# Patient Record
Sex: Female | Born: 1985 | Race: White | Hispanic: No | Marital: Single | Smoking: Current every day smoker
Health system: Southern US, Community
[De-identification: ages and names within clinical notes are randomized; demographics above are authoritative.]

---
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
## 2010-02-26 ENCOUNTER — Encounter: Payer: Self-pay | Admitting: *Deleted

## 2013-03-01 ENCOUNTER — Encounter (HOSPITAL_COMMUNITY): Payer: Self-pay | Admitting: Emergency Medicine

## 2013-03-01 ENCOUNTER — Emergency Department (HOSPITAL_COMMUNITY)
Admission: EM | Admit: 2013-03-01 | Discharge: 2013-03-01 | Disposition: A | Discharge status: Home / Self Care | Payer: Self-pay | Attending: Emergency Medicine | Admitting: Emergency Medicine

## 2013-03-01 DIAGNOSIS — B029 Zoster without complications: Secondary | ICD-10-CM | POA: Insufficient documentation

## 2013-03-01 DIAGNOSIS — F17200 Nicotine dependence, unspecified, uncomplicated: Secondary | ICD-10-CM | POA: Insufficient documentation

## 2013-03-01 MED ORDER — HYDROCODONE-ACETAMINOPHEN 5-325 MG PO TABS: 1.0000 | ORAL_TABLET | ORAL | Status: DC | PRN

## 2013-03-01 MED ORDER — ACYCLOVIR 400 MG PO TABS: 800.0000 mg | ORAL_TABLET | Freq: Every day | ORAL | Status: DC

## 2013-03-01 NOTE — Discharge Instructions (Signed)
Take Acyclovir as directed until gone. Take vicodin as needed for pain. Refer to attached documents for more information. Keep the rash covered.

## 2013-03-01 NOTE — ED Provider Notes (Signed)
CSN: [id number]     Arrival date & time 03/01/13  1104 History   This chart was scribed for non-physician practitioner Alexandra BeckKaitlyn Rivaan Kendall, PA-C, working with Alexandra HornJohn M Bednar, MD, by Alexandra Bruce, ED Scribe. This patient was seen in room TR08C/TR08C and the patient's care was started at 11:24 AM. First MD Initiated Contact with Patient 03/01/13 1114     Chief Complaint  Patient presents with  . Rash   The history is provided by the patient. No language interpreter was used.   HPI Comments: Alexandra Bruce is a 28 y.o. female who presents to the Emergency Department complaining of a rash to her right axilla and breast which began yesterday and has been associated with acute pain. The pt describes the rash as red with small, closed blisters. The pt rates the pain as 8/10, and she characterizes the pain as "throbbing, aching, and burning." She had chicken pox as a child. Alexandra Bruce is a current smoker.    History reviewed. No pertinent past medical history. History reviewed. No pertinent past surgical history. History reviewed. No pertinent family history. History  Substance Use Topics  . Smoking status: Current Every Day Smoker  . Smokeless tobacco: Not on file  . Alcohol Use: Yes   No OB history provided.  Review of Systems  Constitutional: Negative for fever.  Skin: Positive for color change and rash.  All other systems reviewed and are negative.    Allergies  Review of patient's allergies indicates no known allergies.  Home Medications  No current outpatient prescriptions on file.  Triage Vitals: BP 132/56  Pulse 117  Temp(Src) 98 F (36.7 C) (Oral)  Resp 20  Ht 5' (1.524 m)  Wt 140 lb (63.504 kg)  BMI 27.34 kg/m2  SpO2 98%  Physical Exam  Nursing note and vitals reviewed. Constitutional: She is oriented to person, place, and time. She appears well-developed and well-nourished. No distress.  HENT:  Head: Normocephalic and atraumatic.  Eyes: EOM are normal.   Neck: Neck supple. No tracheal deviation present.  Cardiovascular: Normal rate.   Pulmonary/Chest: Effort normal. No respiratory distress.  Musculoskeletal: Normal range of motion.  Neurological: She is alert and oriented to person, place, and time.  Skin: Skin is warm and dry.  Scattered fluid-filled vesicles on erythematous base located above right breast.   Psychiatric: She has a normal mood and affect. Her behavior is normal.    ED Course  Procedures (including critical care time)  DIAGNOSTIC STUDIES: Oxygen Saturation is 98% on room air, normal by my interpretation.    COORDINATION OF CARE:  11:25 AM- Discussed treatment plan with patient, which includes a prescription for an anti-viral medication and pain medication, and the patient agreed to the plan. Reminded pt to keep the rash covered with gauze or clothing because the rash is contagious to others.   Labs Review Labs Reviewed - No data to display Imaging Review No results found.  EKG Interpretation   None       MDM   1. Shingles    11:31 AM Patient has shingles. Patient will have acyclovir and vicodin for pain. Patient advised to keep rash covered.   I personally performed the services described in this documentation, which was scribed in my presence. The recorded information has been reviewed and is accurate.    Alexandra BeckKaitlyn Tacori Kvamme, PA-C 03/01/13 1133

## 2013-03-01 NOTE — ED Notes (Signed)
Pt here with a rash to the right breast and armpit area, rash and red and has small closed blisters

## 2013-03-01 NOTE — ED Provider Notes (Signed)
Medical screening examination/treatment/procedure(s) were performed by non-physician practitioner and as supervising physician I was immediately available for consultation/collaboration.  EKG Interpretation   None        Hurman HornJohn M Karion Cudd, MD 03/01/13 2129

## 2013-03-06 ENCOUNTER — Encounter: Payer: Self-pay | Admitting: Family Medicine

## 2013-03-06 ENCOUNTER — Ambulatory Visit (INDEPENDENT_AMBULATORY_CARE_PROVIDER_SITE_OTHER): Payer: Self-pay | Admitting: Family Medicine

## 2013-03-06 VITALS — BP 122/70 | Ht 60.0 in | Wt 151.0 lb

## 2013-03-06 DIAGNOSIS — B029 Zoster without complications: Secondary | ICD-10-CM

## 2013-03-06 MED ORDER — HYDROCODONE-ACETAMINOPHEN 5-325 MG PO TABS
ORAL_TABLET | ORAL | Status: DC
Start: 2013-03-06 — End: 2014-12-12

## 2013-03-06 NOTE — Patient Instructions (Signed)
Also take two aleave twice per day  Shingles Shingles (herpes zoster) is an infection that is caused by the same virus that causes chickenpox (varicella). The infection causes a painful skin rash and fluid-filled blisters, which eventually break open, crust over, and heal. It may occur in any area of the body, but it usually affects only one side of the body or face. The pain of shingles usually lasts about 1 month. However, some people with shingles may develop long-term (chronic) pain in the affected area of the body. Shingles often occurs many years after the person had chickenpox. It is more common:  In people older than 50 years.  In people with weakened immune systems, such as those with HIV, AIDS, or cancer.  In people taking medicines that weaken the immune system, such as transplant medicines.  In people under great stress. CAUSES  Shingles is caused by the varicella zoster virus (VZV), which also causes chickenpox. After a person is infected with the virus, it can remain in the person's body for years in an inactive state (dormant). To cause shingles, the virus reactivates and breaks out as an infection in a nerve root. The virus can be spread from person to person (contagious) through contact with open blisters of the shingles rash. It will only spread to people who have not had chickenpox. When these people are exposed to the virus, they may develop chickenpox. They will not develop shingles. Once the blisters scab over, the person is no longer contagious and cannot spread the virus to others. SYMPTOMS  Shingles shows up in stages. The initial symptoms may be pain, itching, and tingling in an area of the skin. This pain is usually described as burning, stabbing, or throbbing.In a few days or weeks, a painful red rash will appear in the area where the pain, itching, and tingling were felt. The rash is usually on one side of the body in a band or belt-like pattern. Then, the rash usually  turns into fluid-filled blisters. They will scab over and dry up in approximately 2 3 weeks. Flu-like symptoms may also occur with the initial symptoms, the rash, or the blisters. These may include:  Fever.  Chills.  Headache.  Upset stomach. DIAGNOSIS  Your caregiver will perform a skin exam to diagnose shingles. Skin scrapings or fluid samples may also be taken from the blisters. This sample will be examined under a microscope or sent to a lab for further testing. TREATMENT  There is no specific cure for shingles. Your caregiver will likely prescribe medicines to help you manage the pain, recover faster, and avoid long-term problems. This may include antiviral drugs, anti-inflammatory drugs, and pain medicines. HOME CARE INSTRUCTIONS   Take a cool bath or apply cool compresses to the area of the rash or blisters as directed. This may help with the pain and itching.   Only take over-the-counter or prescription medicines as directed by your caregiver.   Rest as directed by your caregiver.  Keep your rash and blisters clean with mild soap and cool water or as directed by your caregiver.  Do not pick your blisters or scratch your rash. Apply an anti-itch cream or numbing creams to the affected area as directed by your caregiver.  Keep your shingles rash covered with a loose bandage (dressing).  Avoid skin contact with:  Babies.   Pregnant women.   Children with eczema.   Elderly people with transplants.   People with chronic illnesses, such as leukemia or AIDS.  Wear loose-fitting clothing to help ease the pain of material rubbing against the rash.  Keep all follow-up appointments with your caregiver.If the area involved is on your face, you may receive a referral for follow-up to a specialist, such as an eye doctor (ophthalmologist) or an ear, nose, and throat (ENT) doctor. Keeping all follow-up appointments will help you avoid eye complications, chronic pain, or  disability.  SEEK IMMEDIATE MEDICAL CARE IF:   You have facial pain, pain around the eye area, or loss of feeling on one side of your face.  You have ear pain or ringing in your ear.  You have loss of taste.  Your pain is not relieved with prescribed medicines.   Your redness or swelling spreads.   You have more pain and swelling.  Your condition is worsening or has changed.   You have a feveror persistent symptoms for more than 2 3 days.  You have a fever and your symptoms suddenly get worse. MAKE SURE YOU:  Understand these instructions.  Will watch your condition.  Will get help right away if you are not doing well or get worse. Document Released: 01/22/2005 Document Revised: 10/17/2011 Document Reviewed: 09/06/2011 [facility] Patient Information 2014 [place].

## 2013-03-06 NOTE — Progress Notes (Signed)
   Subjective:    Patient ID: Alexandra Bruce, female    DOB: January 05, 1986, 28 y.o.   MRN: [id number]  HPI Patient is here today b/c she went to the ER on Sunday, and they diagnosed her with shingles.  They prescribed her acyclovir and hydrocodone.  It has spread even more to her back.  She is here to follow up.   First noticed a few bumps ehn showering  statted to get painful  Very painful   No fever or chills    Review of Systems No fever no chills no vomiting some back pain no cough ROS otherwise negative    Objective:   Physical Exam  Alert no significant distress. Lungs clear. Heart rare rhythm. Right superior chest and superior back multiple coalescing blisters and pustules      Assessment & Plan:  Impression shingles discussed at length plan maintain Zovirax. Further hydrocodone prescribed. Local measures discussed. Nature of disease discussed. WSL

## 2014-12-12 ENCOUNTER — Emergency Department (HOSPITAL_COMMUNITY): Payer: Self-pay

## 2014-12-12 ENCOUNTER — Emergency Department (HOSPITAL_COMMUNITY)
Admission: EM | Admit: 2014-12-12 | Discharge: 2014-12-12 | Disposition: A | Discharge status: Home / Self Care | Payer: Self-pay | Attending: Emergency Medicine | Admitting: Emergency Medicine

## 2014-12-12 ENCOUNTER — Encounter (HOSPITAL_COMMUNITY): Payer: Self-pay | Admitting: *Deleted

## 2014-12-12 DIAGNOSIS — W1839XA Other fall on same level, initial encounter: Secondary | ICD-10-CM | POA: Insufficient documentation

## 2014-12-12 DIAGNOSIS — Z720 Tobacco use: Secondary | ICD-10-CM | POA: Insufficient documentation

## 2014-12-12 DIAGNOSIS — Z79899 Other long term (current) drug therapy: Secondary | ICD-10-CM | POA: Insufficient documentation

## 2014-12-12 DIAGNOSIS — S62625A Displaced fracture of medial phalanx of left ring finger, initial encounter for closed fracture: Secondary | ICD-10-CM | POA: Insufficient documentation

## 2014-12-12 DIAGNOSIS — S62609A Fracture of unspecified phalanx of unspecified finger, initial encounter for closed fracture: Secondary | ICD-10-CM

## 2014-12-12 DIAGNOSIS — Y9289 Other specified places as the place of occurrence of the external cause: Secondary | ICD-10-CM | POA: Insufficient documentation

## 2014-12-12 DIAGNOSIS — Y998 Other external cause status: Secondary | ICD-10-CM | POA: Insufficient documentation

## 2014-12-12 DIAGNOSIS — Y9389 Activity, other specified: Secondary | ICD-10-CM | POA: Insufficient documentation

## 2014-12-12 MED ORDER — HYDROCODONE-ACETAMINOPHEN 5-325 MG PO TABS: 2.0000 | ORAL_TABLET | ORAL | Status: DC | PRN

## 2014-12-12 MED ORDER — IBUPROFEN 800 MG PO TABS: 800.0000 mg | ORAL_TABLET | Freq: Three times a day (TID) | ORAL | Status: DC

## 2014-12-12 MED ORDER — HYDROCODONE-ACETAMINOPHEN 5-325 MG PO TABS
2.0000 | ORAL_TABLET | Freq: Once | ORAL | Status: AC
  Administered 2014-12-12: 2 via ORAL
  Filled 2014-12-12: qty 2

## 2014-12-12 NOTE — Discharge Instructions (Signed)
Finger Fracture  Fractures of fingers are breaks in the bones of the fingers. There are many types of fractures. There are different ways of treating these fractures. Your health care provider will discuss the best way to treat your fracture.  CAUSES  Traumatic injury is the main cause of broken fingers. These include:  · Injuries while playing sports.  · Workplace injuries.  · Falls.  RISK FACTORS  Activities that can increase your risk of finger fractures include:  · Sports.  · Workplace activities that involve machinery.  · A condition called osteoporosis, which can make your bones less dense and cause them to fracture more easily.  SIGNS AND SYMPTOMS  The main symptoms of a broken finger are pain and swelling within 15 minutes after the injury. Other symptoms include:  · Bruising of your finger.  · Stiffness of your finger.  · Numbness of your finger.  · Exposed bones (compound fracture) if the fracture is severe.  DIAGNOSIS   The best way to diagnose a broken bone is with X-ray imaging. Additionally, your health care provider will use this X-ray image to evaluate the position of the broken finger bones.   TREATMENT   Finger fractures can be treated with:   · Nonreduction--This means the bones are in place. The finger is splinted without changing the positions of the bone pieces. The splint is usually left on for about a week to 10 days. This will depend on your fracture and what your health care provider thinks.  · Closed reduction--The bones are put back into position without using surgery. The finger is then splinted.  · Open reduction and internal fixation--The fracture site is opened. Then the bone pieces are fixed into place with pins or some type of hardware. This is seldom required. It depends on the severity of the fracture.  HOME CARE INSTRUCTIONS   · Follow your health care provider's instructions regarding activities, exercises, and physical therapy.  · Only take over-the-counter or prescription  medicines for pain, discomfort, or fever as directed by your health care provider.  SEEK MEDICAL CARE IF:  You have pain or swelling that limits the motion or use of your fingers.  SEEK IMMEDIATE MEDICAL CARE IF:   Your finger becomes numb.  MAKE SURE YOU:   · Understand these instructions.  · Will watch your condition.  · Will get help right away if you are not doing well or get worse.     This information is not intended to replace advice given to you by your health care provider. Make sure you discuss any questions you have with your health care provider.     Document Released: 05/06/2000 Document Revised: 11/12/2012 Document Reviewed: 09/03/2012  Elsevier Interactive Patient Education ©2016 [employer]

## 2014-12-12 NOTE — ED Provider Notes (Signed)
History  By signing my name below, I, [name], attest that this documentation has been prepared under the direction and in the presence of Langston MaskerKaren Lynasia Meloche, [place]PA-C. Electronically Signed: [name], ED Scribe. 12/12/2014. 7:32 PM.  Chief Complaint  Patient presents with  . Finger Injury   The history is provided by the patient and medical records. No language interpreter was used.    HPI Comments:  Alexandra Bruce is a 29 y.o. female who presents to the Emergency Department complaining of left fourth finger throbbing pain secondary to falling on it approximately two weeks ago. She reports associated bruising and swelling. Pt states this is the first evaluation she has had for this injury since it happened. She has not taken anything for pain. Moving or touching the area makes the pain worse. She denies alleviating factors. She denies numbness, tingling or weakness of the right fourth finger or right hand.   History reviewed. No pertinent past medical history. History reviewed. No pertinent past surgical history. History reviewed. No pertinent family history. Social History  Substance Use Topics  . Smoking status: Current Every Day Smoker  . Smokeless tobacco: None  . Alcohol Use: Yes   OB History    No data available     Review of Systems  Musculoskeletal: Positive for arthralgias.  All other systems reviewed and are negative.   Allergies  Review of patient's allergies indicates no known allergies.  Home Medications   Prior to Admission medications   Medication Sig Start Date End Date Taking? Authorizing Provider  acyclovir (ZOVIRAX) 400 MG tablet Take 2 tablets (800 mg total) by mouth 5 (five) times daily. 03/01/13   Emilia BeckKaitlyn Szekalski, PA-C  HYDROcodone-acetaminophen (NORCO/VICODIN) 5-325 MG per tablet TAKE ONE TABLET EVERY 4-6 HOURS PRN PAIN 03/06/13   Merlyn AlbertWilliam S Luking, MD   Triage Vitals: BP 112/78 mmHg  Pulse 89  Temp(Src) 98.1 F (36.7 C) (Oral)  Resp 18   Ht 5' (1.524 m)  Wt 135 lb (61.236 kg)  BMI 26.37 kg/m2  SpO2 100%  LMP 12/07/2014 Physical Exam  Constitutional: She is oriented to person, place, and time. She appears well-developed and well-nourished.  HENT:  Head: Normocephalic and atraumatic.  Eyes: EOM are normal.  Neck: Normal range of motion.  Cardiovascular: Normal rate.   Pulmonary/Chest: Effort normal.  Musculoskeletal: Normal range of motion.  Neurological: She is alert and oriented to person, place, and time.  Skin: Skin is warm and dry.  Psychiatric: She has a normal mood and affect. Her behavior is normal.  Nursing note and vitals reviewed.   ED Course  Procedures (including critical care time) DIAGNOSTIC STUDIES: Oxygen Saturation is 100% on RA, normal by my interpretation.   COORDINATION OF CARE: 7:31 PM- Will order finger splint and pain medication. Will give referral to orthopedist for follow up. Pt verbalizes understanding and agrees to plan.  Medications - No data to display  Labs Review Labs Reviewed - No data to display  Imaging Review Dg Finger Ring Left  12/12/2014  CLINICAL DATA:  Fall approximately 2 weeks prior landing on left ring finger, pain and swelling since that time. Initial encounter. EXAM: LEFT RING FINGER 2+V COMPARISON:  None. FINDINGS: Comminuted minimally displaced fracture of the ring finger middle phalanx about the distal aspect. Fracture extends to the radial aspect of the distal interphalangeal joint articular surface. Associated soft tissue edema. No radiopaque foreign body. IMPRESSION: Comminuted mildly displaced fracture of the middle phalanx, fracture abuts the radial aspect of the  articular surface at the distal interphalangeal joint. Electronically Signed   By: Rubye Oaks M.D.   On: 12/12/2014 19:07   I have personally reviewed and evaluated these images and lab results as part of my medical decision-making.   EKG Interpretation None      MDM   Final diagnoses:   Phalanx (hand) fracture, closed, initial encounter   Splint Meds ordered this encounter  Medications  . ibuprofen (ADVIL,MOTRIN) 800 MG tablet    Sig: Take 1 tablet (800 mg total) by mouth 3 (three) times daily.    Dispense:  21 tablet    Refill:  0    Order Specific Question:  Supervising Provider    Answer:  MILLER, BRIAN [[id number]]  . HYDROcodone-acetaminophen (NORCO/VICODIN) 5-325 MG per tablet 2 tablet    Sig:   . HYDROcodone-acetaminophen (NORCO/VICODIN) 5-325 MG tablet    Sig: Take 2 tablets by mouth every 4 (four) hours as needed.    Dispense:  10 tablet    Refill:  0    Order Specific Question:  Supervising Provider    Answer:  Eber Hong [[id number]]    I personally performed the services in this documentation, which was scribed in my presence.  The recorded information has been reviewed and considered.   Barnet Pall.    Elson Areas, PA-C 12/12/14 1944  Lonia Skinner Pemberwick, PA-C 12/12/14 1944  Donnetta Hutching, MD 12/12/14 [phone]

## 2014-12-12 NOTE — ED Notes (Signed)
Pt fell about 2 weeks ago and landed on left ring finger wrong, pt with swelling and bruising noted to left ring finger, pt states that she has not seen anyone for this and splinted at home herself

## 2015-06-21 ENCOUNTER — Emergency Department (HOSPITAL_COMMUNITY)
Admission: EM | Admit: 2015-06-21 | Discharge: 2015-06-21 | Disposition: A | Discharge status: Home / Self Care | Payer: Self-pay | Attending: Emergency Medicine | Admitting: Emergency Medicine

## 2015-06-21 ENCOUNTER — Emergency Department (HOSPITAL_COMMUNITY): Payer: Self-pay

## 2015-06-21 ENCOUNTER — Encounter (HOSPITAL_COMMUNITY): Payer: Self-pay | Admitting: *Deleted

## 2015-06-21 DIAGNOSIS — B9689 Other specified bacterial agents as the cause of diseases classified elsewhere: Secondary | ICD-10-CM

## 2015-06-21 DIAGNOSIS — N760 Acute vaginitis: Secondary | ICD-10-CM

## 2015-06-21 DIAGNOSIS — R109 Unspecified abdominal pain: Secondary | ICD-10-CM

## 2015-06-21 DIAGNOSIS — O23591 Infection of other part of genital tract in pregnancy, first trimester: Secondary | ICD-10-CM | POA: Insufficient documentation

## 2015-06-21 DIAGNOSIS — Z87891 Personal history of nicotine dependence: Secondary | ICD-10-CM | POA: Insufficient documentation

## 2015-06-21 DIAGNOSIS — Z79899 Other long term (current) drug therapy: Secondary | ICD-10-CM | POA: Insufficient documentation

## 2015-06-21 DIAGNOSIS — Z3A08 8 weeks gestation of pregnancy: Secondary | ICD-10-CM | POA: Insufficient documentation

## 2015-06-21 DIAGNOSIS — O209 Hemorrhage in early pregnancy, unspecified: Secondary | ICD-10-CM

## 2015-06-21 DIAGNOSIS — O26899 Other specified pregnancy related conditions, unspecified trimester: Secondary | ICD-10-CM

## 2015-06-21 DIAGNOSIS — O034 Incomplete spontaneous abortion without complication: Secondary | ICD-10-CM | POA: Insufficient documentation

## 2015-06-21 DIAGNOSIS — Z791 Long term (current) use of non-steroidal anti-inflammatories (NSAID): Secondary | ICD-10-CM | POA: Insufficient documentation

## 2015-06-21 LAB — WET PREP, GENITAL
Sperm: NONE SEEN
Trich, Wet Prep: NONE SEEN
YEAST WET PREP: NONE SEEN

## 2015-06-21 LAB — URINALYSIS, ROUTINE W REFLEX MICROSCOPIC
BILIRUBIN URINE: NEGATIVE
Glucose, UA: NEGATIVE mg/dL
HGB URINE DIPSTICK: NEGATIVE
KETONES UR: NEGATIVE mg/dL
Leukocytes, UA: NEGATIVE
NITRITE: NEGATIVE
PROTEIN: NEGATIVE mg/dL
SPECIFIC GRAVITY, URINE: 1.01 (ref 1.005–1.030)
pH: 7 (ref 5.0–8.0)

## 2015-06-21 LAB — BASIC METABOLIC PANEL
Anion gap: 5 (ref 5–15)
BUN: 10 mg/dL (ref 6–20)
CALCIUM: 9.2 mg/dL (ref 8.9–10.3)
CHLORIDE: 106 mmol/L (ref 101–111)
CO2: 25 mmol/L (ref 22–32)
CREATININE: 0.78 mg/dL (ref 0.44–1.00)
Glucose, Bld: 98 mg/dL (ref 65–99)
Potassium: 3.7 mmol/L (ref 3.5–5.1)
SODIUM: 136 mmol/L (ref 135–145)

## 2015-06-21 LAB — CBC
HCT: 39.9 % (ref 36.0–46.0)
Hemoglobin: 13.3 g/dL (ref 12.0–15.0)
MCH: 31.1 pg (ref 26.0–34.0)
MCHC: 33.3 g/dL (ref 30.0–36.0)
MCV: 93.2 fL (ref 78.0–100.0)
PLATELETS: 330 10*3/uL (ref 150–400)
RBC: 4.28 MIL/uL (ref 3.87–5.11)
RDW: 12.3 % (ref 11.5–15.5)
WBC: 7.7 10*3/uL (ref 4.0–10.5)

## 2015-06-21 LAB — ABO/RH: ABO/RH(D): O POS

## 2015-06-21 LAB — HCG, QUANTITATIVE, PREGNANCY: HCG, BETA CHAIN, QUANT, S: 2871 m[IU]/mL — AB (ref <5)

## 2015-06-21 LAB — I-STAT BETA HCG BLOOD, ED (MC, WL, AP ONLY): I-stat hCG, quantitative: >2000 m[IU]/mL — ABNORMAL HIGH (ref <5)

## 2015-06-21 MED ORDER — METRONIDAZOLE 500 MG PO TABS: 500.0000 mg | ORAL_TABLET | Freq: Two times a day (BID) | ORAL | Status: AC

## 2015-06-21 NOTE — Discharge Instructions (Signed)
Bleeding during the first 20 weeks of pregnancy can be common. Your ultrasound today showed that the fetus (baby) has miscarried and is no longer viable. Miscarriages occur in 15 to 20% of all pregnancies and usually occur during the first 13 weeks of the pregnancy. The exact cause of a miscarriage is usually never known. A miscarriage is natures way of ending a pregnancy that is abnormal or would not make it to term. There are several ways to manage a miscarriage, including watch-and-waiting for the natural process, medications to help the pregnancy end, and surgical procedures to remove the products of conception (pregnancy tissues, etc). Each option will be further explained to you when you follow up with the OBGYN for further management of the miscarriage, but for now it is safe to just watch-and-wait for the natural process to occur.  You have been tested for gonorrhea, chlamydia, HIV, and Syphilis, and the hospital lab will call you if the tests are positive.   Your vaginal swab showed bacterial vaginosis, take flagyl as directed, and DO NOT DRINK ALCOHOL while taking this medication.  Take tylenol or motrin as needed for pain. Stay well hydrated. You will need to follow up with OBGYN to follow up in 3-5 days after today's visit for ongoing management of your miscarriage. You can call the women's outpatient clinic to do this, or see your regular OBGYN if you prefer. Return to the [facility] emergency room for changes/worsening symptoms as outlined below.   SEEK IMMEDIATE MEDICAL ATTENTION AT THE [facility] IF:  You have severe cramps in your stomach, back, or abdomen.   You have a sudden onset of severe pain in the lower part of your abdomen.   You run an unexplained temperature of 101 F (38.3 C) or higher.   You pass large clots or tissue. Save any tissue for your caregiver to inspect.   Your bleeding increases or you become light-headed, weak, or have fainting episodes.     Incomplete Miscarriage A miscarriage is the sudden loss of an unborn baby (fetus) before the 20th week of pregnancy. In an incomplete miscarriage, parts of the fetus or placenta (afterbirth) remain in the body.  Having a miscarriage can be an emotional experience. Talk with your health care provider about any questions you may have about miscarrying, the grieving process, and your future pregnancy plans. CAUSES   Problems with the fetal chromosomes that make it impossible for the baby to develop normally. Problems with the baby's genes or chromosomes are most often the result of errors that occur by chance as the embryo divides and grows. The problems are not inherited from the parents.  Infection of the cervix or uterus.  Hormone problems.  Problems with the cervix, such as having an incompetent cervix. This is when the tissue in the cervix is not strong enough to hold the pregnancy.  Problems with the uterus, such as an abnormally shaped uterus, uterine fibroids, or congenital abnormalities.  Certain medical conditions.  Smoking, drinking alcohol, or taking illegal drugs.  Trauma. SYMPTOMS   Vaginal bleeding or spotting, with or without cramps or pain.  Pain or cramping in the abdomen or lower back.  Passing fluid, tissue, or blood clots from the vagina. DIAGNOSIS  Your health care provider will perform a physical exam. You may also have an ultrasound to confirm the miscarriage. Blood or urine tests may also be ordered. TREATMENT   Usually, a dilation and curettage (D&C) procedure is performed. During a D&C procedure,  the cervix is widened (dilated) and any remaining fetal or placental tissue is gently removed from the uterus.  Antibiotic medicines are prescribed if there is an infection. Other medicines may be given to reduce the size of the uterus (contract) if there is a lot of bleeding.  If you have Rh negative blood and your baby was Rh positive, you will need a Rho  (D) immune globulin shot. This shot will protect any future baby from having Rh blood problems in future pregnancies.  You may be confined to bed rest. This means you should stay in bed and only get up to use the bathroom. HOME CARE INSTRUCTIONS   Rest as directed by your health care provider.  Restrict activity as directed by your health care provider. You may be allowed to continue light activity if curettage was not done but you require further treatment.  Keep track of the number of pads you use each day. Keep track of how soaked (saturated) they are. Record this information.  Do not  use tampons.  Do not douche or have sexual intercourse until approved by your health care provider.  Keep all follow-up appointments for reevaluation and continuing management.  Only take over-the-counter or prescription medicines for pain, fever, or discomfort as directed by your health care provider.  Take antibiotic medicine as directed by your health care provider. Make sure you finish it even if you start to feel better. SEEK IMMEDIATE MEDICAL CARE IF:   You experience severe cramps in your stomach, back, or abdomen.  You have an unexplained temperature (make sure to record these temperatures).  You pass large clots or tissue (save these for your health care provider to inspect).  Your bleeding increases.  You become light-headed, weak, or have fainting episodes. MAKE SURE YOU:   Understand these instructions.  Will watch your condition.  Will get help right away if you are not doing well or get worse.   This information is not intended to replace advice given to you by your health care provider. Make sure you discuss any questions you have with your health care provider.   Document Released: 01/22/2005 Document Revised: 02/12/2014 Document Reviewed: 08/21/2012 Elsevier Interactive Patient Education 2016 [employer]   Bacterial Vaginosis Bacterial vaginosis is an infection of  the vagina. It happens when too many germs (bacteria) grow in the vagina. Having this infection puts you at risk for getting other infections from sex. Treating this infection can help lower your risk for other infections, such as:   Chlamydia.  Gonorrhea.  HIV.  Herpes. HOME CARE  Take your medicine as told by your doctor.  Finish your medicine even if you start to feel better.  Tell your sex partner that you have an infection. They should see their doctor for treatment.  During treatment:  Avoid sex or use condoms correctly.  Do not douche.  Do not drink alcohol unless your doctor tells you it is ok.  Do not breastfeed unless your doctor tells you it is ok. GET HELP IF:  You are not getting better after 3 days of treatment.  You have more grey fluid (discharge) coming from your vagina than before.  You have more pain than before.  You have a fever. MAKE SURE YOU:   Understand these instructions.  Will watch your condition.  Will get help right away if you are not doing well or get worse.   This information is not intended to replace advice given to you by your  health care provider. Make sure you discuss any questions you have with your health care provider.   Document Released: 11/01/2007 Document Revised: 02/12/2014 Document Reviewed: 09/03/2012 Elsevier Interactive Patient Education [employer]

## 2015-06-21 NOTE — ED Notes (Addendum)
Pt c/o vaginal bleeding onset today about 30 mins ago, pt reports being 3 mths pregnant, denies pain, pt denies injury to the area, pt A&O x4, pt reports mod amt of bleeding, pt denies having to change pad at this time

## 2015-06-21 NOTE — ED Provider Notes (Signed)
CSN: [id number]     Arrival date & time 06/21/15  1543 History  By signing my name below, I, Octavia Heir, attest that this documentation has been prepared under the direction and in the presence of Linford Quintela Camprubi-Soms, PA-C. Electronically Signed: Octavia Heir, ED Scribe. 06/21/2015. 6:05 PM.    Chief Complaint  Patient presents with  . Vaginal Bleeding      Patient is a 30 y.o. female presenting with vaginal bleeding. The history is provided by the patient. No language interpreter was used.  Vaginal Bleeding Quality:  Bright red and typical of menses Severity:  Moderate Onset quality:  Sudden Duration:  2 hours Timing:  Constant Progression:  Improving Chronicity:  New Number of pads used:  1 Possible pregnancy: yes   Context: at rest   Relieved by:  None tried Worsened by:  Nothing tried Ineffective treatments:  None tried Associated symptoms: abdominal pain (abdominal cramping) and nausea (intermittent)   Associated symptoms: no dysuria, no fever and no vaginal discharge   Risk factors: unprotected sex   Risk factors: no hx of ectopic pregnancy, does not have multiple partners, no new sexual partner, no prior miscarriage and no terminated pregnancies    HPI Comments: ROSELIE CIRIGLIANO is a 30 y.o. G1P0 female who is currently pregnant with EGA [redacted]w[redacted]d based on LMP, presents to the Emergency Department complaining of sudden onset, gradual improving, moderate-to-scant, bright red vaginal bleeding onset two hours ago. States the bleeding is similar to a typical menses, only using one pad since onset. Pt says she wiped after using the bathroom and saw bright red blood on her toilet paper, and came straight to the ER. Pt denies passage of clots or tissues. She reports associated constant, 3/10, cramping, nonradiating suprapubic abdominal pain that started while in triage, with no known aggravating or alleviating factors. She also reports intermittent nausea recently. Pt says her  last menstrual cycle was February 20th and she has not seen an OB/GYN for prenatal care yet. This is her first ever pregnancy, no prior miscarriages or elective abortions. Pt has had unprotected sex with one female partner in the past year. She denies fevers, chills, CP, SOB, V/D/C, melena, hematochezia, hematuria, dysuria, vaginal itching, vaginal discharge, genital lesions, myalgias, arthralgias, numbness, tingling, weakness, light-headedness, or rashes.    History reviewed. No pertinent past medical history. History reviewed. No pertinent past surgical history. No family history on file. Social History  Substance Use Topics  . Smoking status: Former Smoker    Types: Cigarettes    Quit date: 05/31/2015  . Smokeless tobacco: None  . Alcohol Use: Yes   OB History    Gravida Para Term Preterm AB TAB SAB Ectopic Multiple Living   1              Review of Systems  Constitutional: Negative for fever and chills.  Respiratory: Negative for shortness of breath.   Cardiovascular: Negative for chest pain.  Gastrointestinal: Positive for nausea (intermittent) and abdominal pain (abdominal cramping). Negative for vomiting, diarrhea, constipation and blood in stool.  Genitourinary: Positive for vaginal bleeding. Negative for dysuria, hematuria, vaginal discharge, genital sores and vaginal pain.  Musculoskeletal: Negative for myalgias and arthralgias.  Skin: Negative for color change.  Allergic/Immunologic: Negative for immunocompromised state.  Neurological: Negative for weakness, light-headedness and numbness.  Psychiatric/Behavioral: Negative for confusion.   10 Systems reviewed and are negative for acute change except as noted in the HPI.    Allergies  Review of patient's allergies indicates  no known allergies.  Home Medications   Prior to Admission medications   Medication Sig Start Date End Date Taking? Authorizing Provider  acyclovir (ZOVIRAX) 400 MG tablet Take 2 tablets (800 mg  total) by mouth 5 (five) times daily. 03/01/13   Emilia Beck, PA-C  HYDROcodone-acetaminophen (NORCO/VICODIN) 5-325 MG tablet Take 2 tablets by mouth every 4 (four) hours as needed. 12/12/14   Elson Areas, PA-C  ibuprofen (ADVIL,MOTRIN) 800 MG tablet Take 1 tablet (800 mg total) by mouth 3 (three) times daily. 12/12/14   Elson Areas, PA-C   Triage vitals: BP 122/67 mmHg  Pulse 69  Temp(Src) 98.5 F (36.9 C) (Oral)  Resp 14  Ht 4' 11.75" (1.518 m)  Wt 152 lb 2 oz (69.003 kg)  BMI 29.95 kg/m2  SpO2 98%  LMP 03/28/2015 Physical Exam  Constitutional: She is oriented to person, place, and time. Vital signs are normal. She appears well-developed and well-nourished.  Non-toxic appearance. No distress.  Afebrile, nontoxic, NAD  HENT:  Head: Normocephalic and atraumatic.  Mouth/Throat: Oropharynx is clear and moist and mucous membranes are normal.  Eyes: Conjunctivae and EOM are normal. Right eye exhibits no discharge. Left eye exhibits no discharge.  Neck: Normal range of motion. Neck supple.  Cardiovascular: Normal rate, regular rhythm, normal heart sounds and intact distal pulses.  Exam reveals no gallop and no friction rub.   No murmur heard. Pulmonary/Chest: Effort normal and breath sounds normal. No respiratory distress. She has no decreased breath sounds. She has no wheezes. She has no rhonchi. She has no rales.  Abdominal: Soft. Normal appearance and bowel sounds are normal. She exhibits no distension. There is tenderness in the suprapubic area. There is no rigidity, no rebound, no guarding, no CVA tenderness, no tenderness at McBurney's point and negative Murphy's sign.  Soft, non distended, +BS throughout, with mild suprapubic TTP, no r/g/r, neg murphy's, neg mcburney's, no CVA TTP   Genitourinary: Pelvic exam was performed with patient supine. There is no rash, tenderness or lesion on the right labia. There is no rash, tenderness or lesion on the left labia. Uterus is enlarged  and tender. Uterus is not deviated and not fixed. Cervix exhibits no motion tenderness, no discharge and no friability. Right adnexum displays no mass, no tenderness and no fullness. Left adnexum displays no mass, no tenderness and no fullness. There is bleeding in the vagina. No erythema or tenderness in the vagina. No signs of injury around the vagina. No vaginal discharge found.  Chaperone present for exam. No rashes, lesions, or tenderness to external genitalia. No erythema, injury, or tenderness to vaginal mucosa. No vaginal discharge, but with scant old vaginal blood within vaginal vault that appears to be coming from the cervix. No adnexal masses, tenderness, or fullness. No CMT, cervical friability, or discharge from cervical os. Cervical os is closed. Uterus non-deviated and mobile, but with mild TTP, and gravid to just above the pubic symphysis  Musculoskeletal: Normal range of motion.  Neurological: She is alert and oriented to person, place, and time. She has normal strength. No sensory deficit.  Skin: Skin is warm, dry and intact. No rash noted.  Psychiatric: She has a normal mood and affect.  Nursing note and vitals reviewed.  ED Course  Procedures  DIAGNOSTIC STUDIES: Oxygen Saturation is 98% on RA, normal by my interpretation.  COORDINATION OF CARE:  5:57 PM Discussed treatment plan which includes lab work and pelvic exam with pt at bedside and pt agreed to plan.  Labs Review Labs Reviewed  WET PREP, GENITAL - Abnormal; Notable for the following:    Clue Cells Wet Prep HPF POC PRESENT (*)    WBC, Wet Prep HPF POC RARE (*)    All other components within normal limits  I-STAT BETA HCG BLOOD, ED ([facility], [facility], [facility] ONLY) - Abnormal; Notable for the following:    I-stat hCG, quantitative >2000.0 (*)    All other components within normal limits  URINE CULTURE  CBC  BASIC METABOLIC PANEL  URINALYSIS, ROUTINE W REFLEX MICROSCOPIC (NOT AT [facility])  HCG, QUANTITATIVE, PREGNANCY  RPR  HIV  ANTIBODY (ROUTINE TESTING)  ABO/RH  GC/CHLAMYDIA PROBE AMP ([place]) NOT AT [facility]    Imaging Review US Ob Comp Less 14 Wks  06/21/2015  CLINICAL DATA:  Vaginal bleeding before [redacted] weeks gestation, vaginal bleeding started a couple hours ago, pregnant, quantitative beta HCG > 2000 EXAM: OBSTETRIC <14 WK [place] AND TRANSVAGINAL OB US TECHNIQUE: Both transabdominal and transvaginal ultrasound examinations were performed for complete evaluation of the gestation as well as the maternal uterus, adnexal regions, and pelvic cul-de-sac. Transvaginal technique was performed to assess early pregnancy. COMPARISON:  None. FINDINGS: Intrauterine gestational sac: Present Yolk sac:  Present Embryo:  Present Cardiac Activity: Absent Heart Rate: N/A  bpm CRL:  17.4  mm   8 w   1 d Subchorionic hemorrhage:  None visualized. Maternal uterus/adnexae: RIGHT ovary normal size and morphology, 2.7 x 1.7 x 2.8 cm. LEFT ovary normal size and morphology, 3.0 x 1.6 x 3.6 cm. No adnexal masses or free pelvic fluid. IMPRESSION: Intrauterine gestational sac identified containing a fetal pole but no fetal cardiac activity is seen compatible with fetal demise. Electronically Signed   By: Ulyses Southward M.D.   On: 06/21/2015 19:12   US Ob Transvaginal  06/21/2015  CLINICAL DATA:  Vaginal bleeding before [redacted] weeks gestation, vaginal bleeding started a couple hours ago, pregnant, quantitative beta HCG > 2000 EXAM: OBSTETRIC <14 WK [place] AND TRANSVAGINAL OB US TECHNIQUE: Both transabdominal and transvaginal ultrasound examinations were performed for complete evaluation of the gestation as well as the maternal uterus, adnexal regions, and pelvic cul-de-sac. Transvaginal technique was performed to assess early pregnancy. COMPARISON:  None. FINDINGS: Intrauterine gestational sac: Present Yolk sac:  Present Embryo:  Present Cardiac Activity: Absent Heart Rate: N/A  bpm CRL:  17.4  mm   8 w   1 d Subchorionic hemorrhage:  None visualized. Maternal  uterus/adnexae: RIGHT ovary normal size and morphology, 2.7 x 1.7 x 2.8 cm. LEFT ovary normal size and morphology, 3.0 x 1.6 x 3.6 cm. No adnexal masses or free pelvic fluid. IMPRESSION: Intrauterine gestational sac identified containing a fetal pole but no fetal cardiac activity is seen compatible with fetal demise. Electronically Signed   By: Ulyses Southward M.D.   On: 06/21/2015 19:12   I have personally reviewed and evaluated these images and lab results as part of my medical decision-making.   EKG Interpretation None      MDM   Final diagnoses:  Vaginal bleeding before [redacted] weeks gestation  Abdominal cramping affecting pregnancy  BV (bacterial vaginosis)  Incomplete miscarriage    30 y.o. female here with scant vaginal bleeding x2hrs, currently [redacted]w[redacted]d EGA based on LMP. Started having some mild cramping while waiting in the lobby. On exam, mildly tender in suprapubic area, nonperitoneal. Pelvic exam reveals uterus gravid with mild tenderness, cervical os closed with scant bleeding coming from the os, no adnexal tenderness or masses. Labs  taken in triage show: istat HCG >2000, CBC WNL, ABO/Rh O+ so she doesn't need rhogam, and BMP WNL. Will get quant HCG to help with follow up serial testing; will get STD testing, U/A and UCx, wet prep, and U/S to eval pregnancy. Likely threatened SAb, but U/S will help guide us. Pt declines needing anything for pain. Will monitor and reassess after remaining labs and U/S result.   7:47 PM Wet prep showing clue cells, will tx with flagyl. Quant HCG not yet in process although it was sent, but U/S returning and showing Intrauterine gestational sac with a fetal pole but without cardiac activity which is compatible with fetal demise. Doubt we need to wait for quant HCG since this is an incomplete miscarriage. Will await U/A to ensure no other comorbid conditions. Pt updated and has no questions at this time.  9:11 PM U/A unremarkable. QuantHCG still pending, but  this does not change our management today, it will just help at follow up to ensure the Quants go down to zero. For now we will proceed with expectant management, no medications or surgical interventions needed at this time, but discussed that this will be further explained to her in f/up when she sees her OBGYN/women's clinic. Strict return precautions discussed, but pt is stable with stable H/H, and doubt need for emergent transfusion or that this is going to progress to unstable vaginal bleeding at this time. Discussed s/sx of this, and to report immediately to the women's hospital if this occurs. Discussed that HIV/RPR/GC/CT testing will result in a few days and lab will call if positive. No s/sx today that concern me for GC/CT, doubt need for empiric tx. Overall, will be sending home with flagyl for BV. Discussed tylenol/motrin for pain, and f/up with OBGYN in 3-5 days for ongoing management of her miscarriage. I explained the diagnosis and have given explicit precautions to return to the ER including for any other new or worsening symptoms. The patient understands and accepts the medical plan as it's been dictated and I have answered their questions. Discharge instructions concerning home care and prescriptions have been given. The patient is STABLE and is discharged to home in good condition.   I personally performed the services described in this documentation, which was scribed in my presence. The recorded information has been reviewed and is accurate.  BP 122/67 mmHg  Pulse 69  Temp(Src) 98.5 F (36.9 C) (Oral)  Resp 14  Ht 4' 11.75" (1.518 m)  Wt 69.003 kg  BMI 29.95 kg/m2  SpO2 98%  LMP 03/28/2015  Meds ordered this encounter  Medications  . metroNIDAZOLE (FLAGYL) 500 MG tablet    Sig: Take 1 tablet (500 mg total) by mouth 2 (two) times daily. One po bid x 7 days    Dispense:  14 tablet    Refill:  0    Order Specific Question:  Supervising Provider    Answer:  Eber HongMILLER, BRIAN [[id number]]        Danya Spearman Camprubi-Soms, PA-C 06/21/15 [id number]  Pricilla LovelessScott Goldston, MD 06/22/15 [id number]

## 2015-06-22 LAB — GC/CHLAMYDIA PROBE AMP (~~LOC~~) NOT AT ARMC
Chlamydia: NEGATIVE
Neisseria Gonorrhea: NEGATIVE

## 2015-06-22 LAB — RPR: RPR: NONREACTIVE

## 2015-06-22 LAB — HIV ANTIBODY (ROUTINE TESTING W REFLEX): HIV SCREEN 4TH GENERATION: NONREACTIVE

## 2015-06-23 LAB — URINE CULTURE

## 2016-07-27 ENCOUNTER — Encounter (HOSPITAL_COMMUNITY): Payer: Self-pay | Admitting: Emergency Medicine

## 2016-07-27 ENCOUNTER — Emergency Department (HOSPITAL_COMMUNITY)
Admission: EM | Admit: 2016-07-27 | Discharge: 2016-07-27 | Disposition: A | Discharge status: Home / Self Care | Payer: Self-pay | Attending: Emergency Medicine | Admitting: Emergency Medicine

## 2016-07-27 ENCOUNTER — Emergency Department (HOSPITAL_COMMUNITY): Payer: Self-pay

## 2016-07-27 DIAGNOSIS — Y929 Unspecified place or not applicable: Secondary | ICD-10-CM | POA: Insufficient documentation

## 2016-07-27 DIAGNOSIS — S61411A Laceration without foreign body of right hand, initial encounter: Secondary | ICD-10-CM

## 2016-07-27 DIAGNOSIS — W25XXXA Contact with sharp glass, initial encounter: Secondary | ICD-10-CM | POA: Insufficient documentation

## 2016-07-27 DIAGNOSIS — Y939 Activity, unspecified: Secondary | ICD-10-CM | POA: Insufficient documentation

## 2016-07-27 DIAGNOSIS — Y999 Unspecified external cause status: Secondary | ICD-10-CM | POA: Insufficient documentation

## 2016-07-27 DIAGNOSIS — Z23 Encounter for immunization: Secondary | ICD-10-CM | POA: Insufficient documentation

## 2016-07-27 DIAGNOSIS — S61214A Laceration without foreign body of right ring finger without damage to nail, initial encounter: Secondary | ICD-10-CM | POA: Insufficient documentation

## 2016-07-27 DIAGNOSIS — Z87891 Personal history of nicotine dependence: Secondary | ICD-10-CM | POA: Insufficient documentation

## 2016-07-27 MED ORDER — TETANUS-DIPHTH-ACELL PERTUSSIS 5-2.5-18.5 LF-MCG/0.5 IM SUSP
0.5000 mL | Freq: Once | INTRAMUSCULAR | Status: AC
  Administered 2016-07-27: 0.5 mL via INTRAMUSCULAR
  Filled 2016-07-27: qty 0.5

## 2016-07-27 MED ORDER — LIDOCAINE HCL 1 % IJ SOLN
INTRAMUSCULAR | Status: AC
  Filled 2016-07-27: qty 20

## 2016-07-27 MED ORDER — HYDROCODONE-ACETAMINOPHEN 5-325 MG PO TABS
2.0000 | ORAL_TABLET | Freq: Once | ORAL | Status: AC
  Administered 2016-07-27: 2 via ORAL
  Filled 2016-07-27: qty 2

## 2016-07-27 MED ORDER — LIDOCAINE HCL (PF) 1 % IJ SOLN
5.0000 mL | Freq: Once | INTRAMUSCULAR | Status: AC
  Administered 2016-07-27: 5 mL
  Filled 2016-07-27: qty 30

## 2016-07-27 MED ORDER — HYDROCODONE-ACETAMINOPHEN 5-325 MG PO TABS: 2.0000 | ORAL_TABLET | ORAL | 0 refills | Status: AC | PRN
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
## 2016-07-27 NOTE — Discharge Instructions (Signed)
Suture removal in 7-8 days  

## 2016-07-28 NOTE — ED Provider Notes (Signed)
[facility]-EMERGENCY DEPT Provider Note   CSN: [id number] Arrival date & time: 07/27/16  1855     History   Chief Complaint Chief Complaint  Patient presents with  . Laceration    HPI Alexandra Bruce is a 31 y.o. female.  The history is provided by the patient. No language interpreter was used.  Laceration   The incident occurred 1 to 2 hours ago. The laceration is located on the right hand. The laceration is 1 cm in size. The laceration mechanism was a broken glass. The pain is moderate. The pain has been constant since onset. She reports no foreign bodies present. Her tetanus status is out of date.  Pt cut hand/finger on a broken glass.    History reviewed. No pertinent past medical history.  There are no active problems to display for this patient.   History reviewed. No pertinent surgical history.  OB History    Gravida Para Term Preterm AB Living   1             SAB TAB Ectopic Multiple Live Births                   Home Medications    Prior to Admission medications   Medication Sig Start Date End Date Taking? Authorizing Provider  acetaminophen (TYLENOL) 325 MG tablet Take 650 mg by mouth every 6 (six) hours as needed for mild pain.    [provider]  HYDROcodone-acetaminophen (NORCO/VICODIN) 5-325 MG tablet Take 2 tablets by mouth every 4 (four) hours as needed. 07/27/16   Elson AreasSofia, Mourad Cwikla K, PA-C  metroNIDAZOLE (FLAGYL) 500 MG tablet Take 1 tablet (500 mg total) by mouth 2 (two) times daily. One po bid x 7 days 06/21/15   Street, [place]Mercedes, [place] JerseyPA-C    Family History History reviewed. No pertinent family history.  Social History Social History  Substance Use Topics  . Smoking status: Former Smoker    Types: Cigarettes    Quit date: 05/31/2015  . Smokeless tobacco: Never Used  . Alcohol use Yes     Allergies   Patient has no known allergies.   Review of Systems Review of Systems  All other systems reviewed and are negative.    Physical  Exam Updated Vital Signs BP 127/75 (BP Location: Left Arm)   Pulse 77   Temp 98.5 F (36.9 C) (Oral)   Resp 20   LMP 06/26/2016   SpO2 100%   Physical Exam  Constitutional: She is oriented to person, place, and time. She appears well-developed.  HENT:  Head: Normocephalic.  Cardiovascular: Normal rate.   Musculoskeletal: She exhibits tenderness.  1 cm laceration between right 4th and 5th fingers,    Neurological: She is alert and oriented to person, place, and time.  Psychiatric: She has a normal mood and affect.  Vitals reviewed.    ED Treatments / Results  Labs (all labs ordered are listed, but only abnormal results are displayed) Labs Reviewed - No data to display  EKG  EKG Interpretation None       Radiology Dg Hand Complete Right  Result Date: 07/27/2016 CLINICAL DATA:  Laceration between fourth and fifth fingers. EXAM: RIGHT HAND - COMPLETE 3+ VIEW COMPARISON:  None. FINDINGS: There is no evidence of fracture or dislocation. There is no evidence of arthropathy or other focal bone abnormality. Soft tissues are unremarkable. No radiopaque foreign body. IMPRESSION: No acute osseous abnormality or radiopaque foreign body. Electronically Signed   By: Chrisandra NettersKevin  Herman M.D.  On: 07/27/2016 20:08    Procedures .Marland KitchenLaceration Repair Date/Time: 07/28/2016 12:38 AM Performed by: Elson Areas Authorized by: Elson Areas   Consent:    Consent obtained:  Verbal   Consent given by:  Patient   Risks discussed:  Infection Anesthesia (see MAR for exact dosages):    Anesthesia method:  Local infiltration Laceration details:    Location:  Finger   Finger location:  R ring finger   Length (cm):  1.5   Depth (mm):  3 Repair type:    Repair type:  Simple Pre-procedure details:    Preparation:  Patient was prepped and draped in usual sterile fashion Treatment:    Area cleansed with:  Betadine   Amount of cleaning:  Standard   Irrigation solution:  Sterile saline    Irrigation method:  Syringe Skin repair:    Repair method:  Sutures   Suture size:  5-0   Suture technique:  Simple interrupted   Number of sutures:  5 Approximation:    Approximation:  Close   Vermilion border: well-aligned   Post-procedure details:    Patient tolerance of procedure:  Tolerated well, no immediate complications   (including critical care time)  Medications Ordered in ED Medications  Tdap (BOOSTRIX) injection 0.5 mL (0.5 mLs Intramuscular Given 07/27/16 2044)  lidocaine (PF) (XYLOCAINE) 1 % injection 5 mL (5 mLs Infiltration Given 07/27/16 2100)  HYDROcodone-acetaminophen (NORCO/VICODIN) 5-325 MG per tablet 2 tablet (2 tablets Oral Given 07/27/16 2127)     Initial Impression / Assessment and Plan / ED Course  I have reviewed the triage vital signs and the nursing notes.  Pertinent labs & imaging results that were available during my care of the patient were reviewed by me and considered in my medical decision making (see chart for details).     Suture removal in 8 days. Tetanus given  Final Clinical Impressions(s) / ED Diagnoses   Final diagnoses:  Laceration of right hand without foreign body, initial encounter    New Prescriptions Discharge Medication List as of 07/27/2016  9:14 PM    START taking these medications   Details  HYDROcodone-acetaminophen (NORCO/VICODIN) 5-325 MG tablet Take 2 tablets by mouth every 4 (four) hours as needed., Starting Fri 07/27/2016, Print      An After Visit Summary was printed and given to the patient.   Elson Areas, PA-C 07/28/16 0040    Rolan Bucco, MD 07/28/16 [phone]

## 2017-09-30 IMAGING — US US OB COMP LESS 14 WK
1 series · 14 of 28 positions shown · non-contrast
Comparison: None.

CLINICAL DATA: Vaginal bleeding before 22 weeks gestation, vaginal
bleeding started a couple hours ago, pregnant, quantitative beta HCG
> 6777

EXAM:
OBSTETRIC <14 WK US AND TRANSVAGINAL OB US
TECHNIQUE: Both transabdominal and transvaginal ultrasound examinations were
performed for complete evaluation of the gestation as well as the
maternal uterus, adnexal regions, and pelvic cul-de-sac.
Transvaginal technique was performed to assess early pregnancy.

[Series 1: us ob comp less 14 wk · 0.23mm/px · 14 of 71 slices shown]
[im 3/71]
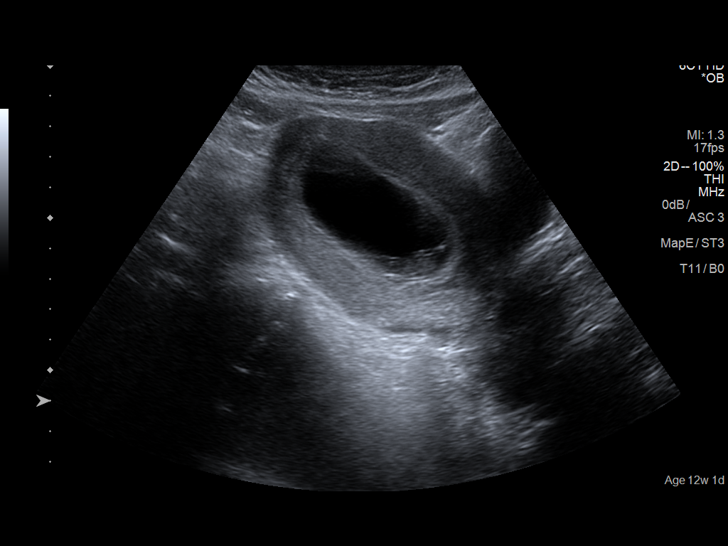
[im 8/71]
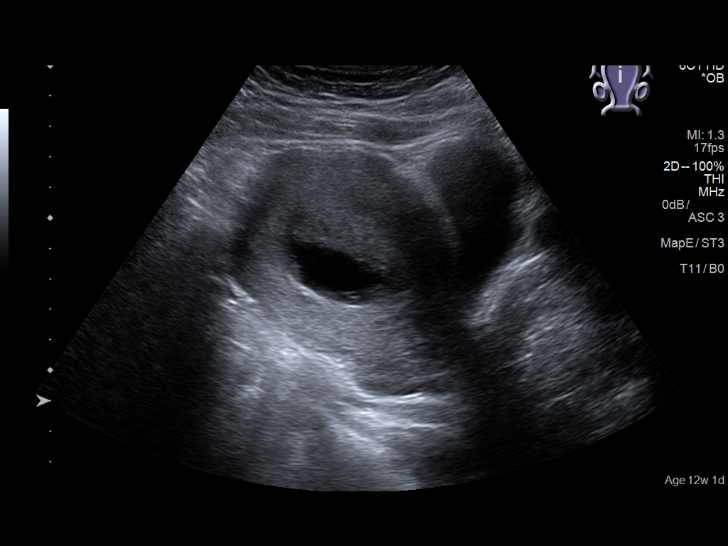
[im 13/71]
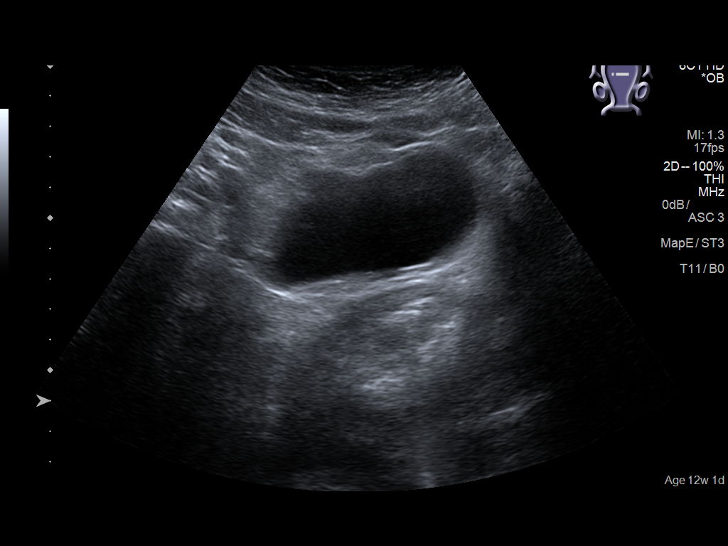
[im 19/71]
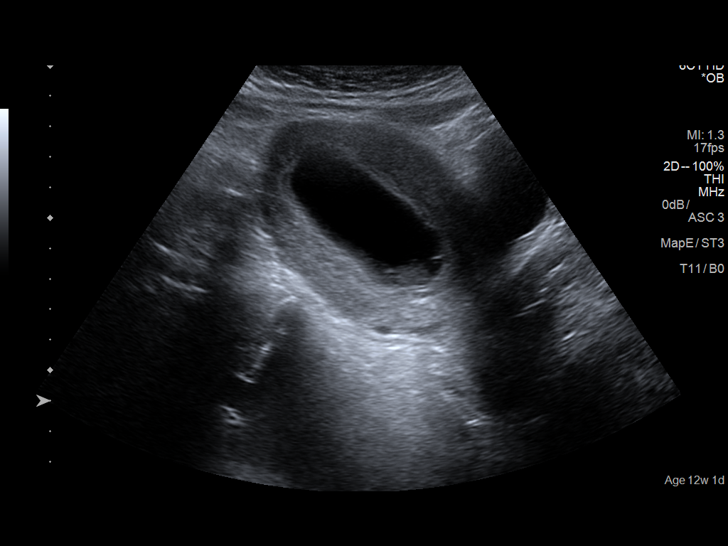
[im 24/71]
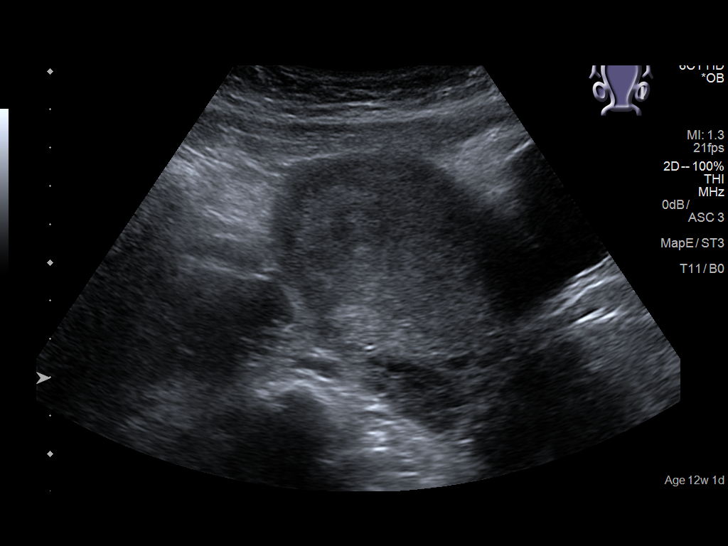
[im 29/71]
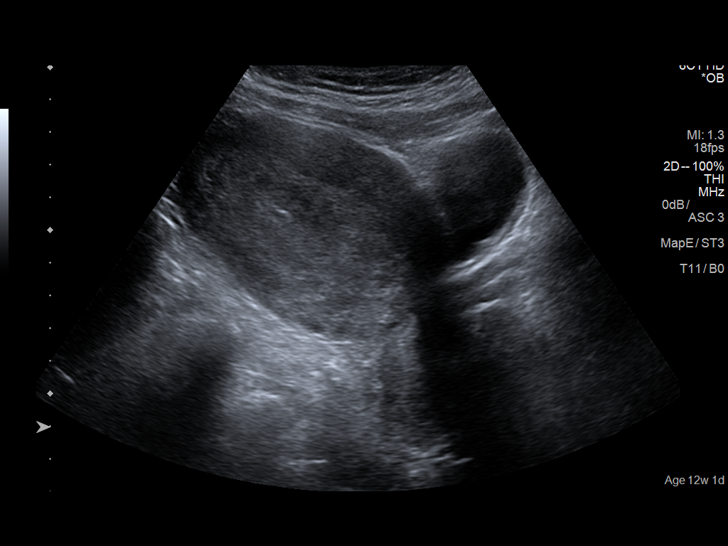
[im 34/71]
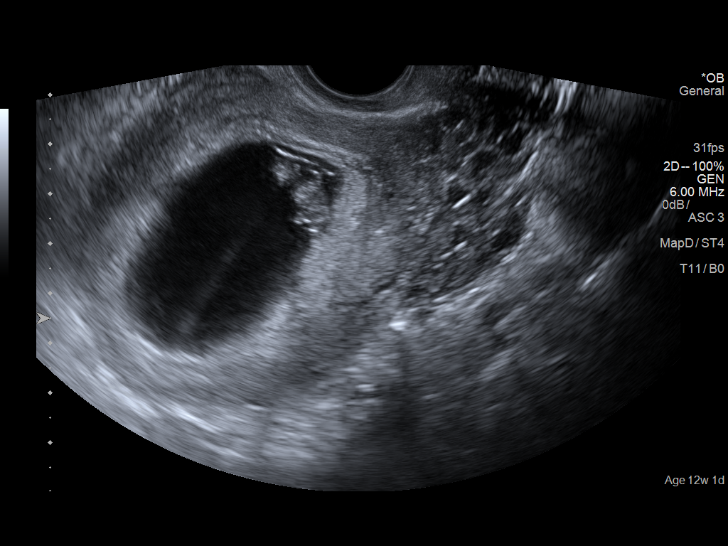
[im 39/71]
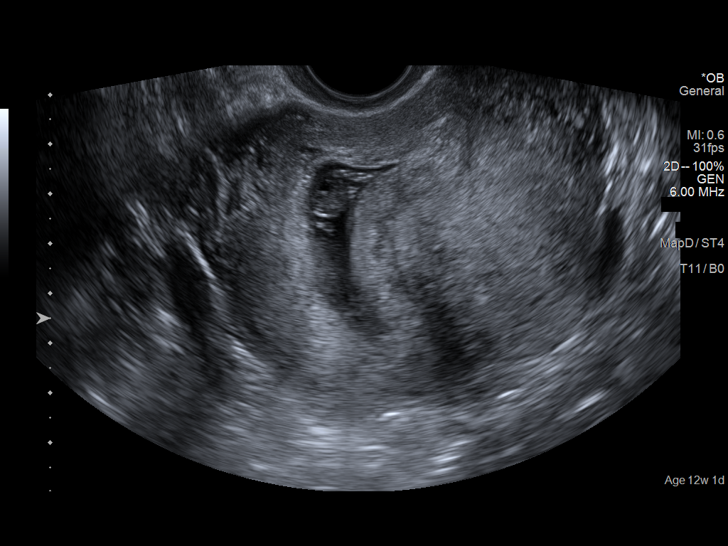
[im 45/71]
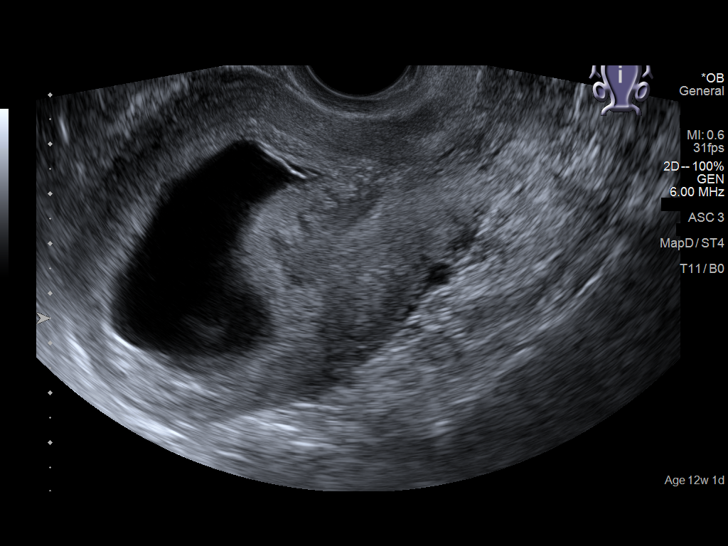
[im 50/71]
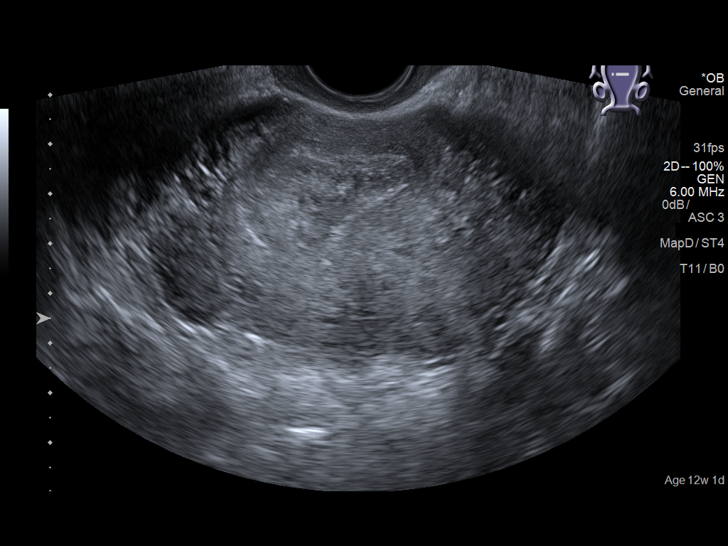
[im 55/71]
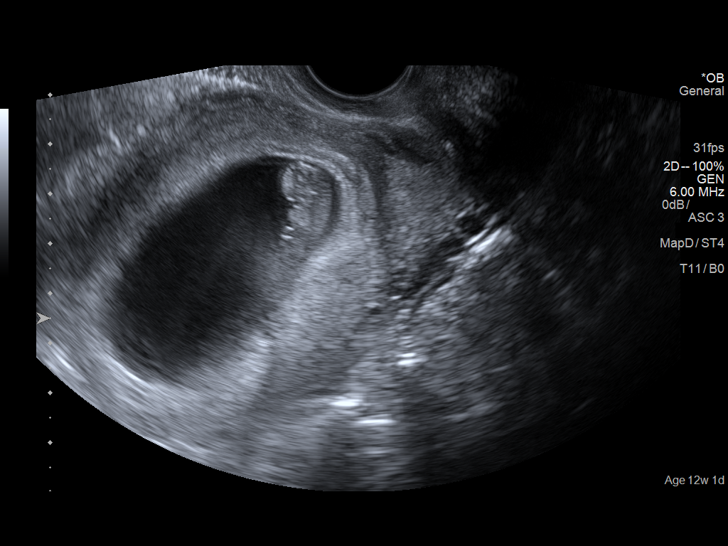
[im 60/71]
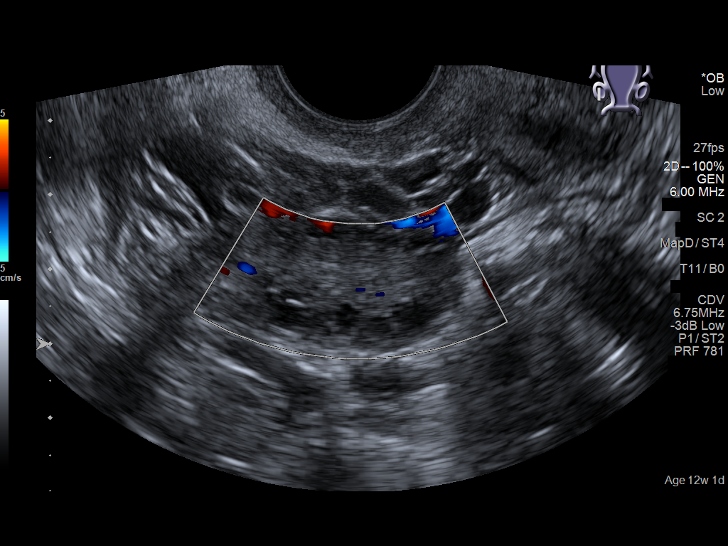
[im 65/71]
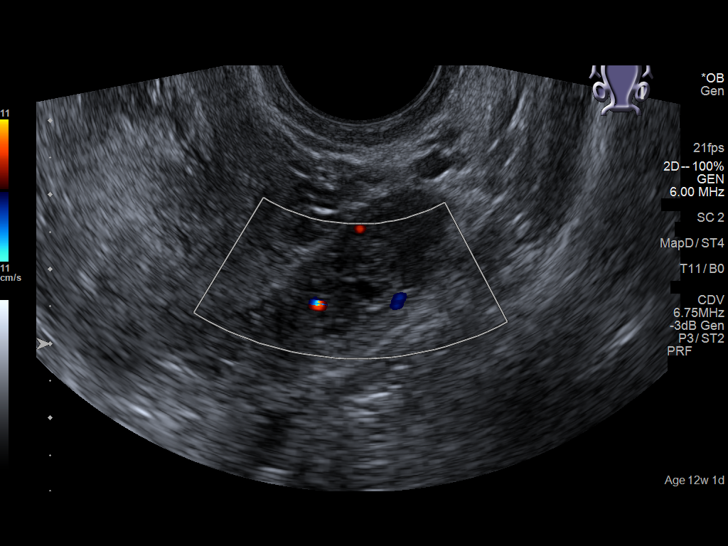
[im 71/71]
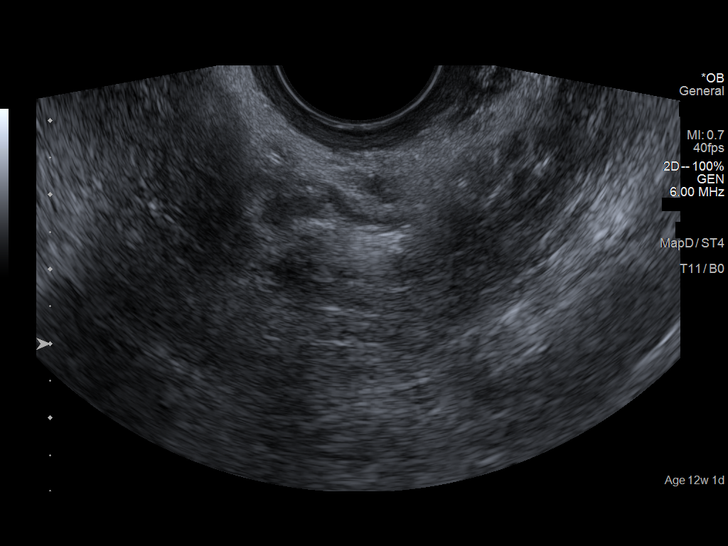

[14 of 28 positions shown; findings below may reference images not displayed]

FINDINGS: Intrauterine gestational sac: Present

Yolk sac:  Present

Embryo:  Present

Cardiac Activity: Absent

Heart Rate: N/A  bpm

CRL:  17.4  mm   8 w   1 d

Subchorionic hemorrhage:  None visualized.

Maternal uterus/adnexae:

RIGHT ovary normal size and morphology, 2.7 x 1.7 x 2.8 cm.

LEFT ovary normal size and morphology, 3.0 x 1.6 x 3.6 cm.

No adnexal masses or free pelvic fluid.
IMPRESSION: Intrauterine gestational sac identified containing a fetal pole but
no fetal cardiac activity is seen compatible with fetal demise.

## 2017-11-18 IMAGING — CR DG HAND COMPLETE 3+V*R*
3 series · 3 of 3 positions shown · non-contrast
Comparison: None.

CLINICAL DATA: Laceration between fourth and fifth fingers.

EXAM:
RIGHT HAND - COMPLETE 3+ VIEW

[x hand pa right]
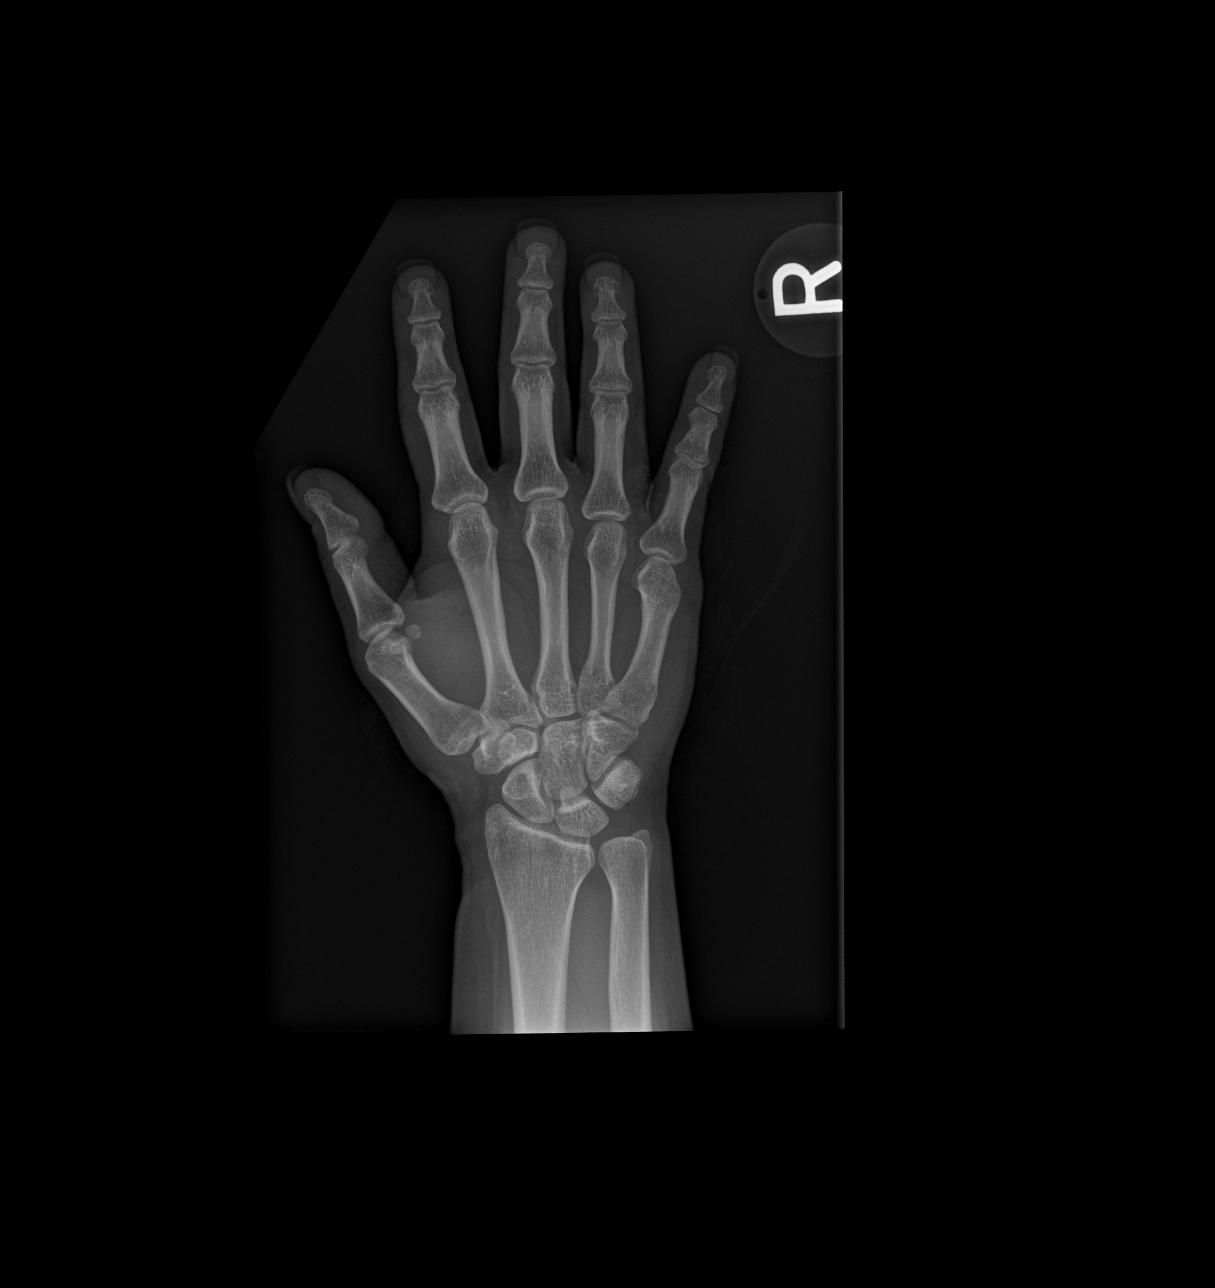

[x hand obl right]
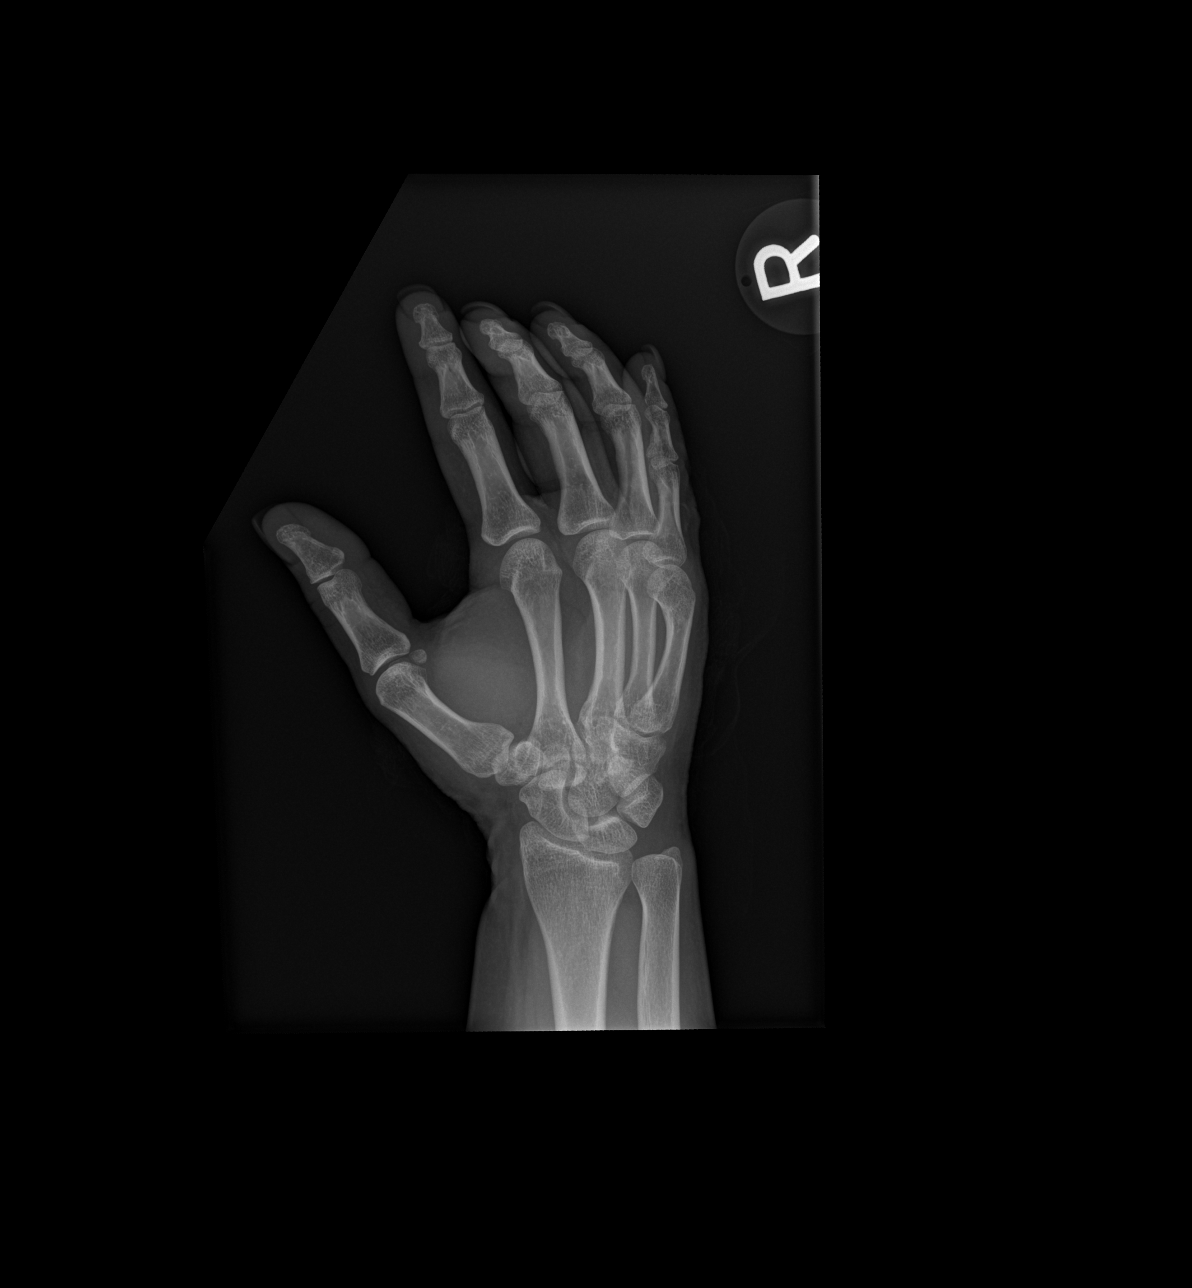

[x hand lat right]
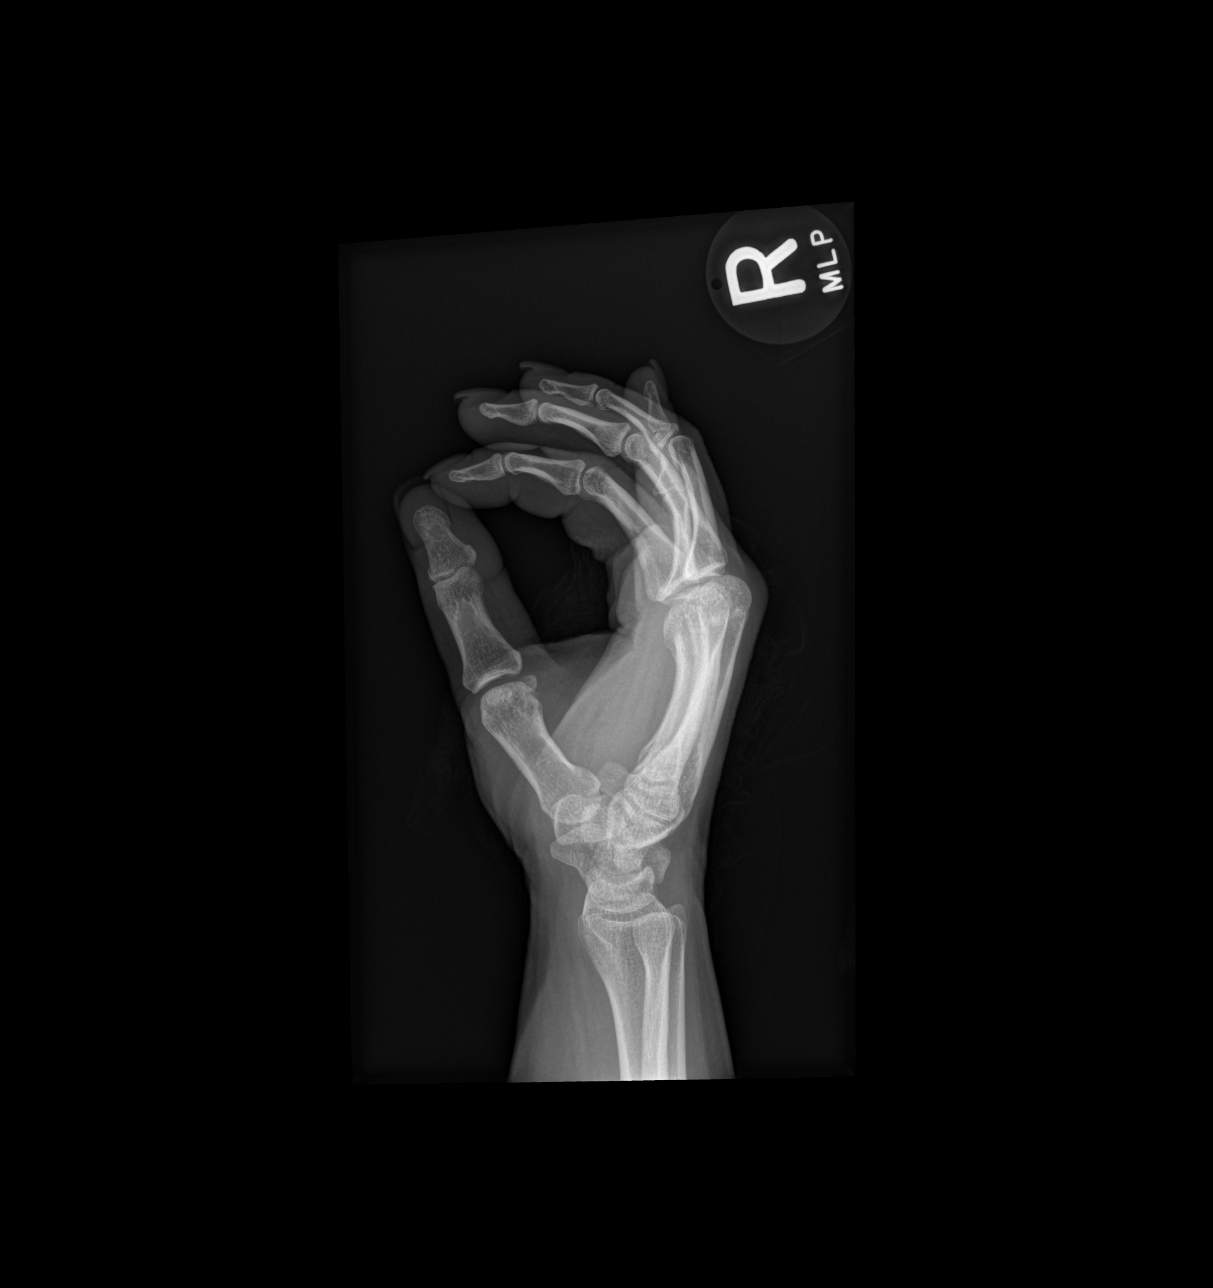

[3 of 3 positions shown; findings below may reference images not displayed]

FINDINGS: There is no evidence of fracture or dislocation. There is no
evidence of arthropathy or other focal bone abnormality. Soft
tissues are unremarkable. No radiopaque foreign body.
IMPRESSION: No acute osseous abnormality or radiopaque foreign body.

## 2018-06-24 ENCOUNTER — Encounter (HOSPITAL_COMMUNITY): Payer: Self-pay | Admitting: Emergency Medicine

## 2018-06-24 ENCOUNTER — Other Ambulatory Visit: Payer: Self-pay

## 2018-06-24 ENCOUNTER — Emergency Department (HOSPITAL_COMMUNITY)
Admission: EM | Admit: 2018-06-24 | Discharge: 2018-06-24 | Discharge status: Against Medical Advice | Payer: Self-pay | Attending: Emergency Medicine | Admitting: Emergency Medicine

## 2018-06-24 DIAGNOSIS — Z5329 Procedure and treatment not carried out because of patient's decision for other reasons: Secondary | ICD-10-CM | POA: Insufficient documentation

## 2018-06-24 DIAGNOSIS — F17210 Nicotine dependence, cigarettes, uncomplicated: Secondary | ICD-10-CM | POA: Insufficient documentation

## 2018-06-24 DIAGNOSIS — R1030 Lower abdominal pain, unspecified: Secondary | ICD-10-CM | POA: Insufficient documentation

## 2018-06-24 DIAGNOSIS — N841 Polyp of cervix uteri: Secondary | ICD-10-CM | POA: Insufficient documentation

## 2018-06-24 LAB — URINALYSIS, ROUTINE W REFLEX MICROSCOPIC
Bilirubin Urine: NEGATIVE
Glucose, UA: NEGATIVE mg/dL
Ketones, ur: NEGATIVE mg/dL
Nitrite: NEGATIVE
Protein, ur: NEGATIVE mg/dL
Specific Gravity, Urine: 1.003 — ABNORMAL LOW (ref 1.005–1.030)
pH: 6 (ref 5.0–8.0)

## 2018-06-24 LAB — COMPREHENSIVE METABOLIC PANEL
ALT: 21 U/L (ref 0–44)
AST: 19 U/L (ref 15–41)
Albumin: 4.4 g/dL (ref 3.5–5.0)
Alkaline Phosphatase: 79 U/L (ref 38–126)
Anion gap: 10 (ref 5–15)
BUN: 5 mg/dL — ABNORMAL LOW (ref 6–20)
CO2: 26 mmol/L (ref 22–32)
Calcium: 9.4 mg/dL (ref 8.9–10.3)
Chloride: 102 mmol/L (ref 98–111)
Creatinine, Ser: 0.68 mg/dL (ref 0.44–1.00)
GFR calc Af Amer: >60 mL/min (ref >60)
GFR calc non Af Amer: >60 mL/min (ref >60)
Glucose, Bld: 99 mg/dL (ref 70–99)
Potassium: 3.4 mmol/L — ABNORMAL LOW (ref 3.5–5.1)
Sodium: 138 mmol/L (ref 135–145)
Total Bilirubin: 0.5 mg/dL (ref 0.3–1.2)
Total Protein: 7.8 g/dL (ref 6.5–8.1)

## 2018-06-24 LAB — CBC WITH DIFFERENTIAL/PLATELET
Abs Immature Granulocytes: 0.04 10*3/uL (ref 0.00–0.07)
Basophils Absolute: 0.1 10*3/uL (ref 0.0–0.1)
Basophils Relative: 1 %
Eosinophils Absolute: 0.2 10*3/uL (ref 0.0–0.5)
Eosinophils Relative: 2 %
HCT: 44.8 % (ref 36.0–46.0)
Hemoglobin: 14.8 g/dL (ref 12.0–15.0)
Immature Granulocytes: 0 %
Lymphocytes Relative: 21 %
Lymphs Abs: 2.6 10*3/uL (ref 0.7–4.0)
MCH: 31.6 pg (ref 26.0–34.0)
MCHC: 33 g/dL (ref 30.0–36.0)
MCV: 95.5 fL (ref 80.0–100.0)
Monocytes Absolute: 0.8 10*3/uL (ref 0.1–1.0)
Monocytes Relative: 6 %
Neutro Abs: 8.6 10*3/uL — ABNORMAL HIGH (ref 1.7–7.7)
Neutrophils Relative %: 70 %
Platelets: 332 10*3/uL (ref 150–400)
RBC: 4.69 MIL/uL (ref 3.87–5.11)
RDW: 12.7 % (ref 11.5–15.5)
WBC: 12.3 10*3/uL — ABNORMAL HIGH (ref 4.0–10.5)
nRBC: 0 % (ref 0.0–0.2)

## 2018-06-24 LAB — WET PREP, GENITAL
Clue Cells Wet Prep HPF POC: NONE SEEN
Sperm: NONE SEEN
Trich, Wet Prep: NONE SEEN
Yeast Wet Prep HPF POC: NONE SEEN

## 2018-06-24 LAB — LIPASE, BLOOD: Lipase: 23 U/L (ref 11–51)

## 2018-06-24 LAB — I-STAT BETA HCG BLOOD, ED (MC, WL, AP ONLY): I-stat hCG, quantitative: <5 m[IU]/mL (ref <5)

## 2018-06-24 MED ORDER — SODIUM CHLORIDE 0.9 % IV BOLUS
1000.0000 mL | Freq: Once | INTRAVENOUS | Status: AC
  Administered 2018-06-24: 1000 mL via INTRAVENOUS

## 2018-06-24 MED ORDER — SODIUM CHLORIDE 0.9% FLUSH: 3.0000 mL | Freq: Once | INTRAVENOUS | Status: DC

## 2018-06-24 NOTE — ED Provider Notes (Signed)
[facility] EMERGENCY DEPARTMENT Provider Note   CSN: [id number] Arrival date & time: 06/24/18  1459    History   Chief Complaint Chief Complaint  Patient presents with  . Abdominal Pain    HPI Alexandra Bruce is a 33 y.o. female   She reports that she finished her normal menstrual period on 06/19/2018 and has had continued spotting since that time, on 5/16 patient developed bilateral lower back pain greater left than right throbbing in nature constant that has since transitioned to her lower abdomen.  Patient states that she is no longer having lower back pain there is now having lower abdominal pain cramping constant moderate intensity without aggravating factors.  Patient has taken tramadol for pain with minimal relief.  Patient reports that she has had continued spotting.  Patient denies fever/chills, chest pain/shortness of breath, cough, dysuria/hematuria, vaginal discharge, concern for sexually transmitted diseases, diarrhea or any additional concerns.     HPI  History reviewed. No pertinent past medical history.  There are no active problems to display for this patient.   History reviewed. No pertinent surgical history.   OB History    Gravida  1   Para      Term      Preterm      AB      Living        SAB      TAB      Ectopic      Multiple      Live Births               Home Medications    Prior to Admission medications   Medication Sig Start Date End Date Taking? Authorizing Provider  HYDROcodone-acetaminophen (NORCO/VICODIN) 5-325 MG tablet Take 2 tablets by mouth every 4 (four) hours as needed. Patient not taking: Reported on 06/24/2018 07/27/16   Elson Areas, PA-C  metroNIDAZOLE (FLAGYL) 500 MG tablet Take 1 tablet (500 mg total) by mouth 2 (two) times daily. One po bid x 7 days Patient not taking: Reported on 06/24/2018 06/21/15   Street, [place], PA-C    Family History History reviewed. No pertinent family  history.  Social History Social History   Tobacco Use  . Smoking status: Current Every Day Smoker    Types: Cigarettes    Last attempt to quit: 05/31/2015    Years since quitting: 3.0  . Smokeless tobacco: Never Used  Substance Use Topics  . Alcohol use: Yes  . Drug use: No     Allergies   Patient has no known allergies.   Review of Systems Review of Systems  Constitutional: Negative.  Negative for chills and fever.  Respiratory: Negative.  Negative for cough and shortness of breath.   Cardiovascular: Negative.  Negative for chest pain.  Gastrointestinal: Positive for abdominal pain and nausea. Negative for diarrhea and vomiting.  Genitourinary: Positive for vaginal bleeding. Negative for dysuria, flank pain (Resolved x2 days), hematuria and vaginal discharge.  All other systems reviewed and are negative.  Physical Exam Updated Vital Signs BP 124/68 (BP Location: Right Arm)   Pulse 76   Temp 98.3 F (36.8 C) (Oral)   Resp 16   LMP 06/14/2018   SpO2 100%   Physical Exam Constitutional:      General: She is not in acute distress.    Appearance: Normal appearance. She is well-developed. She is not ill-appearing or diaphoretic.  HENT:     Head: Normocephalic and atraumatic.  Right Ear: External ear normal.     Left Ear: External ear normal.     Nose: Nose normal.  Eyes:     General: Vision grossly intact. Gaze aligned appropriately.     Pupils: Pupils are equal, round, and reactive to light.  Neck:     Musculoskeletal: Normal range of motion.     Trachea: Trachea and phonation normal. No tracheal deviation.  Cardiovascular:     Rate and Rhythm: Normal rate and regular rhythm.     Pulses:          Dorsalis pedis pulses are 2+ on the right side and 2+ on the left side.       Posterior tibial pulses are 2+ on the right side and 2+ on the left side.  Pulmonary:     Effort: Pulmonary effort is normal. No respiratory distress.     Breath sounds: Normal breath  sounds.  Abdominal:     General: There is no distension.     Palpations: Abdomen is soft.     Tenderness: There is abdominal tenderness in the suprapubic area. There is no right CVA tenderness, left CVA tenderness, guarding or rebound. Negative signs include Murphy's sign and McBurney's sign.  Genitourinary:       Comments: Exam chaperoned by Leatha Gilding RN.  Pelvic exam: normal external genitalia without evidence of trauma. VULVA: normal appearing vulva with no masses, tenderness or lesion. VAGINA: normal appearing vagina with normal color and discharge, no lesions. CERVIX: polyp-like growth present to nine-o'clock position; cervical motion tenderness absent, cervical os closed scant blood present from os.  Wet prep and DNA probe for chlamydia and GC obtained.   ADNEXA: normal adnexa in size, no masses; mild left side/adnexal tenderness. UTERUS: uterus is normal size, shape, consistency and nontender.  Musculoskeletal: Normal range of motion.  Skin:    General: Skin is warm and dry.  Neurological:     Mental Status: She is alert.     GCS: GCS eye subscore is 4. GCS verbal subscore is 5. GCS motor subscore is 6.     Comments: Speech is clear and goal oriented, follows commands Major Cranial nerves without deficit, no facial droop Moves extremities without ataxia, coordination intact  Psychiatric:        Behavior: Behavior normal.      ED Treatments / Results  Labs (all labs ordered are listed, but only abnormal results are displayed) Labs Reviewed  WET PREP, GENITAL - Abnormal; Notable for the following components:      Result Value   WBC, Wet Prep HPF POC MANY (*)    All other components within normal limits  COMPREHENSIVE METABOLIC PANEL - Abnormal; Notable for the following components:   Potassium 3.4 (*)    BUN 5 (*)    All other components within normal limits  URINALYSIS, ROUTINE W REFLEX MICROSCOPIC - Abnormal; Notable for the following components:   Color, Urine  STRAW (*)    Specific Gravity, Urine 1.003 (*)    Hgb urine dipstick SMALL (*)    Leukocytes,Ua TRACE (*)    Bacteria, UA RARE (*)    All other components within normal limits  CBC WITH DIFFERENTIAL/PLATELET - Abnormal; Notable for the following components:   WBC 12.3 (*)    Neutro Abs 8.6 (*)    All other components within normal limits  LIPASE, BLOOD  I-STAT BETA HCG BLOOD, ED ([facility], [facility], [facility] ONLY)  GC/CHLAMYDIA PROBE AMP ([facility]) NOT AT [facility]  EKG None  Radiology No results found.  Procedures Procedures (including critical care time)  Medications Ordered in ED Medications  sodium chloride flush (NS) 0.9 % injection 3 mL (3 mLs Intravenous Not Given 06/24/18 1759)  sodium chloride 0.9 % bolus 1,000 mL (0 mLs Intravenous Stopped 06/24/18 2023)     Initial Impression / Assessment and Plan / ED Course  I have reviewed the triage vital signs and the nursing notes.  Pertinent labs & imaging results that were available during my care of the patient were reviewed by me and considered in my medical decision making (see chart for details).    33 year old female presents with light vaginal bleeding that has been ongoing since her menstrual cycle ended 5 days ago.  3 days ago she developed lower back pain that has now transitioned into suprapubic pain.  She is overall very well-appearing and in no acute distress vital signs stable.  Patient with mild suprapubic tenderness on examination no guarding or rebound or peritoneal signs.  Lungs clear to auscultation bilaterally, heart regular rate and rhythm without murmur.  Work-up is begun, lab work ordered. - Beta-hCG negative CBC with leukocytosis of 12.3 Lipase within normal limits CMP nonacute Urinalysis with trace leukocytes, rare bacteria, 0-5 white blood cells, nitrite negative, patient without urinary symptoms doubt UTI, will send for culture.  Patient reevaluated, resting comfortably no acute distress.  Pelvic examination  explained to patient and she consents to pelvic examination. - Pelvic examination performed with Mallory RN chaperone.  Cervical polyp noted to 9 o'clock position.  Scant blood present from cervical loss.  Mild tenderness in the area of the left adnexa no palpable masses, no acute pain.  No cervical motion tenderness.  No vaginal discharge.  Clinically does not appear to be PID.  As patient reports to be in a monogamous relationship she has refused HIV/RPR and STD testing today she has been informed that GC chlamydia tests are pending and will result in 2-3 days that she should use protection and abstain from sex until these test result inform all partners to be tested and treated if they result positive she states understanding. - Patient's case discussed with Dr. Donnald Garre, modality discussed ultrasound versus CT, advises CT, will proceed with CT abdomen pelvis for further evaluation of her suprapubic/left lower quadrant abdominal pain and mild leukocytosis.  Patient states understanding of care plan and is agreeable to CT abdomen pelvis.  Additionally discussed cervical growth/suspected polyp will need outpatient OB/GYN follow-up. - Patient reassessed multiple times resting comfortably each time no acute distress vital signs remained stable.  I discussed the patient suspected cervical polyp with her, I have given her referral to OB/GYN and encouraged her to call them to schedule follow-up within 1 week for further evaluation.  Patient states understanding that cervical polyp is not the cause of her abdominal pain. - Unfortunately there has been a delay in imaging due to volume today and patient is requesting to leave at this time as she must go to work.  CT has arrived at bedside to take her to for her scan however she has refused.  I had a long discussion with the patient but the risks of leaving AGAINST MEDICAL ADVICE including worsening of pain/symptoms, infection, disability and death.  Patient is  fully alert and oriented with the mental capacity to make her own medical decisions she states understanding of the risks and still chooses to leave AGAINST MEDICAL ADVICE.  I have advised the patient that she may  return to the emergency department at any time for further evaluation and treatment and she states understanding.  I attempted multiple times to convince the patient to stay and even offered her a work note and she still chose to leave AGAINST MEDICAL ADVICE.  She is ambulatory around the emergency department and in no acute distress, patient has left AGAINST MEDICAL ADVICE.  Note: Portions of this report may have been transcribed using voice recognition software. Every effort was made to ensure accuracy; however, inadvertent computerized transcription errors may still be present. Final Clinical Impressions(s) / ED Diagnoses   Final diagnoses:  Lower abdominal pain  Cervical polyp    ED Discharge Orders    None       Elizabeth PalauMorelli, Ahonesty Woodfin A, PA-C 06/24/18 2049    Arby BarrettePfeiffer, Marcy, MD 07/02/18 1352

## 2018-06-24 NOTE — ED Notes (Signed)
Pt ambulated self efficiently to restroom with no difficulty. Pt returned safely to bedside. 

## 2018-06-24 NOTE — ED Triage Notes (Signed)
Pt complains of lower back pain that started on Saturday. Then on Sunday, pt started having light bright red blood spotting. Pt started having really bad cramping 10/10 pain. Denies any urinary symptoms. Last period ended this past thursday.

## 2018-06-24 NOTE — Discharge Instructions (Signed)
You are choosing to leave AGAINST MEDICAL ADVICE at this time.  I advised that you stay here in the emergency department to complete your work-up.  Leaving AGAINST MEDICAL ADVICE can lead to increase in pain, worsening of potential infections, disability and death.  If you change your mind you may return to the emergency department at any time for further evaluation and treatment.  Again I advise that you return to emergency department as soon as possible for further evaluation.  As we discussed during your pelvic examination today there was noted to be a growth on your cervix similar to a polyp I advised that you follow-up with an OB/GYN for further evaluation of this I have given you referral to local OB/GYN you may follow-up with call their office tomorrow to schedule an appointment.  I do not think that this polyp is the cause of your pain today and again further work-up here in the emergency department is necessary to evaluate the cause of your abdominal pain.  Additionally you have been tested for gonorrhea and chlamydia today your tests will be available on your MyChart account within 2-3 days.  Please use protection while engaging in sexual intercourse.  If your tests are positive please be sure to inform all of your sexual partners that they will need testing and treatment.  Return to the emergency department or follow-up with your primary care provider for treatment if your STD tests are positive.  Abstain from sex while your tests are pending.  Get help right away if: Your pain returns or worsens You cannot stop throwing up. Your pain is only in areas of your belly, such as the right side or the left lower part of the belly. You have bloody or black poop, or poop that looks like tar. You have very bad pain, cramping, or bloating in your belly. You have signs of not having enough fluid or water in your body (dehydration), such as: Dark pee, very little pee, or no pee. Cracked lips. Dry  mouth. Sunken eyes. Sleepiness. Weakness.

## 2018-06-25 LAB — GC/CHLAMYDIA PROBE AMP (~~LOC~~) NOT AT ARMC
Chlamydia: POSITIVE — AB
Neisseria Gonorrhea: NEGATIVE

## 2023-09-20 ENCOUNTER — Other Ambulatory Visit: Payer: Self-pay

## 2023-09-20 ENCOUNTER — Encounter (HOSPITAL_COMMUNITY): Payer: Self-pay | Admitting: Emergency Medicine

## 2023-09-20 ENCOUNTER — Emergency Department (HOSPITAL_COMMUNITY)

## 2023-09-20 ENCOUNTER — Emergency Department (HOSPITAL_COMMUNITY)
Admission: EM | Admit: 2023-09-20 | Discharge: 2023-09-20 | Disposition: A | Discharge status: Home / Self Care | Attending: Emergency Medicine | Admitting: Emergency Medicine

## 2023-09-20 DIAGNOSIS — W1841XA Slipping, tripping and stumbling without falling due to stepping on object, initial encounter: Secondary | ICD-10-CM | POA: Insufficient documentation

## 2023-09-20 DIAGNOSIS — W19XXXA Unspecified fall, initial encounter: Secondary | ICD-10-CM

## 2023-09-20 DIAGNOSIS — S300XXA Contusion of lower back and pelvis, initial encounter: Secondary | ICD-10-CM

## 2023-09-20 DIAGNOSIS — M545 Low back pain, unspecified: Secondary | ICD-10-CM | POA: Insufficient documentation

## 2023-09-20 LAB — CBC WITH DIFFERENTIAL/PLATELET
Abs Immature Granulocytes: 0.03 K/uL (ref 0.00–0.07)
Basophils Absolute: 0.1 K/uL (ref 0.0–0.1)
Basophils Relative: 1 %
Eosinophils Absolute: 0.2 K/uL (ref 0.0–0.5)
Eosinophils Relative: 3 %
HCT: 40.6 % (ref 36.0–46.0)
Hemoglobin: 13.7 g/dL (ref 12.0–15.0)
Immature Granulocytes: 0 %
Lymphocytes Relative: 29 %
Lymphs Abs: 2.6 K/uL (ref 0.7–4.0)
MCH: 31.6 pg (ref 26.0–34.0)
MCHC: 33.7 g/dL (ref 30.0–36.0)
MCV: 93.8 fL (ref 80.0–100.0)
Monocytes Absolute: 0.8 K/uL (ref 0.1–1.0)
Monocytes Relative: 9 %
Neutro Abs: 5.4 K/uL (ref 1.7–7.7)
Neutrophils Relative %: 58 %
Platelets: 334 K/uL (ref 150–400)
RBC: 4.33 MIL/uL (ref 3.87–5.11)
RDW: 13.2 % (ref 11.5–15.5)
WBC: 9.2 K/uL (ref 4.0–10.5)
nRBC: 0 % (ref 0.0–0.2)

## 2023-09-20 LAB — BASIC METABOLIC PANEL WITH GFR
Anion gap: 9 (ref 5–15)
BUN: 10 mg/dL (ref 6–20)
CO2: 19 mmol/L — ABNORMAL LOW (ref 22–32)
Calcium: 8.8 mg/dL — ABNORMAL LOW (ref 8.9–10.3)
Chloride: 109 mmol/L (ref 98–111)
Creatinine, Ser: 0.95 mg/dL (ref 0.44–1.00)
GFR, Estimated: >60 mL/min (ref >60)
Glucose, Bld: 88 mg/dL (ref 70–99)
Potassium: 4.2 mmol/L (ref 3.5–5.1)
Sodium: 137 mmol/L (ref 135–145)

## 2023-09-20 LAB — HCG, SERUM, QUALITATIVE: Preg, Serum: NEGATIVE

## 2023-09-20 MED ORDER — KETOROLAC TROMETHAMINE 15 MG/ML IJ SOLN
15.0000 mg | Freq: Once | INTRAMUSCULAR | Status: AC
  Administered 2023-09-20: 15 mg via INTRAVENOUS
  Filled 2023-09-20: qty 1

## 2023-09-20 MED ORDER — HYDROMORPHONE HCL 1 MG/ML IJ SOLN
0.5000 mg | Freq: Once | INTRAMUSCULAR | Status: AC
  Administered 2023-09-20: 0.5 mg via INTRAVENOUS
  Filled 2023-09-20: qty 0.5

## 2023-09-20 MED ORDER — METHOCARBAMOL 750 MG PO TABS: 750.0000 mg | ORAL_TABLET | Freq: Three times a day (TID) | ORAL | 0 refills | Status: AC

## 2023-09-20 MED ORDER — PREDNISONE 10 MG PO TABS: 40.0000 mg | ORAL_TABLET | Freq: Every day | ORAL | 0 refills | Status: AC

## 2023-09-20 MED ORDER — DEXAMETHASONE SODIUM PHOSPHATE 10 MG/ML IJ SOLN
10.0000 mg | Freq: Once | INTRAMUSCULAR | Status: AC
  Administered 2023-09-20: 10 mg via INTRAVENOUS
  Filled 2023-09-20: qty 1

## 2023-09-20 MED ORDER — IOHEXOL 300 MG/ML  SOLN
100.0000 mL | Freq: Once | INTRAMUSCULAR | Status: AC | PRN
  Administered 2023-09-20: 100 mL via INTRAVENOUS

## 2023-09-20 NOTE — ED Triage Notes (Signed)
 Pt to the ED with complaints of right side back pain after a fall a few days ago.

## 2023-09-20 NOTE — Discharge Instructions (Signed)
 Please follow-up closely with primary care doctor on an outpatient basis.  Return to emergency department immediately for any new or worsening symptoms.  [facility] Primary Care Doctor List    Rollene Pesa, MD. Specialty: [facility] Medicine Contact information: [address]  [place] [address]  [phone]   Glendia Fielding, MD. Specialty: [facility] Medicine Contact information: [address]  [place] [address]  [phone]   Benita Outhouse, MD Specialty: Internal Medicine Contact information: [address] [place] [address]  [phone]   Darlyn Hurst, MD. Specialty: Internal Medicine Contact information: [address]  [place] [address]  [phone]    [facility] (Dr. Luke) Specialty: Family Medicine Contact information: [address]  [place] [address]  [phone]   Garnette Lolling, MD. Specialty: [facility] Medicine Contact information: [address]  [address]  [place] [address]  [phone]   Gaither Langton, MD. Specialty: Internal Medicine Contact information: [address]  [address]  [place] [address]  [phone]   [facility] Family Medicine: [address] [phone]  [place], Family medicine [address]  [phone]  [facility] [address] [place], [place] [phone]  [facility]: [address] [phone]    [facility]  [address] [place], [place] [address] [phone]  Services [facility] offers a variety of basic health services.  Services include but are not limited to: Blood pressure checks  Heart rate checks  Blood sugar checks  Urine analysis  Rapid strep tests  Pregnancy tests.  Health education and referrals  People needing more complex services will be directed to a physician online. Using these virtual visits, doctors can evaluate  and prescribe medicine and treatments. There will be no medication on-site, though [facility] will help patients fill their prescriptions at little to no cost.   For More information please go to: [email or web address]  Allergy and Asthma:    [address] [place] [phone]  Urology:  [address]  [place] [phone]  [facility]  [address] [place], [place] [phone]  Orthopedics   [address] [place], [place] [phone]  Endocrinology  [address] [place], [place] [phone]  Podiatry: [facility] [phone]

## 2023-09-20 NOTE — ED Provider Notes (Signed)
 [place] EMERGENCY DEPARTMENT AT [facility] Provider Note   CSN: [id number] Arrival date & time: 09/20/23  1354     Patient presents with: Alexandra Bruce is a 38 y.o. female.   Patient is a 38 year old female who presents emergency department the chief complaint of right lower back pain with radiation down her right leg.  Patient notes approximate 2 nights ago she had a fall over her cat directly onto the right lower aspect of her back.  She notes that she has been experiencing pain since that time.  She has been taking Advil  at home with no improvement in her symptoms.  She denies any abdominal pain, nausea, vomiting, diarrhea.  She denies striking her head or injuring her neck during the fall.  She denies any urinary bowel incontinence, saddle paresthesias, gait changes.   Fall       Prior to Admission medications   Medication Sig Start Date End Date Taking? Authorizing Provider  HYDROcodone -acetaminophen  (NORCO/VICODIN) 5-325 MG tablet Take 2 tablets by mouth every 4 (four) hours as needed. Patient not taking: Reported on 06/24/2018 07/27/16   Sofia, Leslie K, PA-C  metroNIDAZOLE  (FLAGYL ) 500 MG tablet Take 1 tablet (500 mg total) by mouth 2 (two) times daily. One po bid x 7 days Patient not taking: Reported on 06/24/2018 06/21/15   [address], [place], [place]    Allergies: Patient has no known allergies.    Review of Systems  Musculoskeletal:  Positive for back pain.  All other systems reviewed and are negative.   Updated Vital Signs BP 122/84   Pulse (!) 58   Temp 98.8 F (37.1 C) (Oral)   Resp 17   Ht 5' (1.524 m)   Wt 79.3 kg   LMP 08/13/2023 (Approximate)   SpO2 99%   BMI 34.14 kg/m   Physical Exam Vitals and nursing note reviewed.  Constitutional:      Appearance: Normal appearance.  HENT:     Head: Normocephalic and atraumatic.  Eyes:     Extraocular Movements: Extraocular movements intact.     Conjunctiva/sclera: Conjunctivae  normal.     Pupils: Pupils are equal, round, and reactive to light.  Cardiovascular:     Rate and Rhythm: Normal rate and regular rhythm.     Pulses: Normal pulses.     Heart sounds: Normal heart sounds.  Pulmonary:     Effort: Pulmonary effort is normal.     Breath sounds: Normal breath sounds.  Abdominal:     General: Abdomen is flat. Bowel sounds are normal. There is no distension.     Palpations: Abdomen is soft.     Tenderness: There is no abdominal tenderness. There is no guarding.  Musculoskeletal:        General: Normal range of motion.     Cervical back: Normal range of motion and neck supple.     Comments: Tender to palpation over right flank and right lower back, no midline tenderness, no step-off or deformity, positive straight leg raise on the right, sensation intact bilateral lower extremities, no bony tenderness to bilateral lower extremities  Skin:    General: Skin is warm and dry.  Neurological:     General: No focal deficit present.     Mental Status: She is alert and oriented to person, place, and time. Mental status is at baseline.  Psychiatric:        Mood and Affect: Mood normal.        Behavior: Behavior  normal.        Thought Content: Thought content normal.        Judgment: Judgment normal.     (all labs ordered are listed, but only abnormal results are displayed) Labs Reviewed  BASIC METABOLIC PANEL WITH GFR  CBC WITH DIFFERENTIAL/PLATELET  HCG, SERUM, QUALITATIVE    EKG: None  Radiology: No results found.   Procedures   Medications Ordered in the ED  HYDROmorphone  (DILAUDID ) injection 0.5 mg (has no administration in time range)  ketorolac  (TORADOL ) 15 MG/ML injection 15 mg (has no administration in time range)  dexamethasone  (DECADRON ) injection 10 mg (has no administration in time range)                                    Medical Decision Making Patient is doing well at this time and is stable for discharge home.  She notes that  symptoms have greatly improved with treatment in the emergency department.  Suspect contusion at this point with lumbar radiculopathy.  Will continue symptomatic treatment on outpatient basis.  CT scan of the abdomen and pelvis was unremarkable.  She had no other secondary sites of injury or pain.  Do not suspect underlying etiology such as cauda equina syndrome, vertebral osteomyelitis, epidural abscess.  Do not suspect MRI is warranted at this time.  She has no red flag back pain symptoms and is otherwise neurovascularly intact.  The need for close follow-up with her primary care doctor was discussed as well as strict turn precautions for any new or worsening symptoms.  Patient voiced understand to the plan and had no additional questions.  Amount and/or Complexity of Data Reviewed Labs: ordered. Radiology: ordered.  Risk Prescription drug management.        Final diagnoses:  None    ED Discharge Orders     None          Alexandra Bruce 09/20/23 2243    Patsey Lot, MD 09/23/23 [phone]
